# Patient Record
Sex: Male | Born: 1955
Health system: Southern US, Community
[De-identification: ages and names within clinical notes are randomized; demographics above are authoritative.]

## PROBLEM LIST (undated history)

## (undated) DIAGNOSIS — H509 Unspecified strabismus: Secondary | ICD-10-CM

## (undated) DIAGNOSIS — T7840XA Allergy, unspecified, initial encounter: Secondary | ICD-10-CM

## (undated) DIAGNOSIS — I10 Essential (primary) hypertension: Secondary | ICD-10-CM

## (undated) DIAGNOSIS — E785 Hyperlipidemia, unspecified: Secondary | ICD-10-CM

## (undated) HISTORY — DX: Unspecified strabismus: H50.9

## (undated) HISTORY — DX: Allergy, unspecified, initial encounter: T78.40XA

## (undated) HISTORY — DX: Essential (primary) hypertension: I10

## (undated) HISTORY — DX: Hyperlipidemia, unspecified: E78.5

---

## 2003-11-01 HISTORY — PX: COLONOSCOPY: SHX174

## 2010-08-01 ENCOUNTER — Emergency Department: Payer: Self-pay | Admitting: Emergency Medicine

## 2012-08-29 ENCOUNTER — Other Ambulatory Visit: Payer: Self-pay | Admitting: Family Medicine

## 2012-08-29 NOTE — Telephone Encounter (Signed)
Medication refilled per protocol.Patient needs to be seen before any further refills 

## 2012-10-17 ENCOUNTER — Encounter: Payer: Self-pay | Admitting: Family Medicine

## 2012-10-17 ENCOUNTER — Ambulatory Visit (INDEPENDENT_AMBULATORY_CARE_PROVIDER_SITE_OTHER): Payer: 59 | Admitting: Family Medicine

## 2012-10-17 VITALS — BP 128/74 | HR 60 | Temp 98.1°F | Resp 14 | Wt 193.0 lb

## 2012-10-17 DIAGNOSIS — T148 Other injury of unspecified body region: Secondary | ICD-10-CM

## 2012-10-17 DIAGNOSIS — W57XXXA Bitten or stung by nonvenomous insect and other nonvenomous arthropods, initial encounter: Secondary | ICD-10-CM

## 2012-10-17 DIAGNOSIS — I10 Essential (primary) hypertension: Secondary | ICD-10-CM | POA: Insufficient documentation

## 2012-10-17 DIAGNOSIS — E785 Hyperlipidemia, unspecified: Secondary | ICD-10-CM | POA: Insufficient documentation

## 2012-10-17 MED ORDER — DOXYCYCLINE HYCLATE 100 MG PO TABS: 100.0000 mg | ORAL_TABLET | Freq: Two times a day (BID) | ORAL | Status: DC

## 2012-10-17 MED ORDER — MOMETASONE FUROATE 0.1 % EX OINT: TOPICAL_OINTMENT | Freq: Every day | CUTANEOUS | Status: DC

## 2012-10-17 NOTE — Progress Notes (Signed)
  Subjective:    Patient ID: Daniel Rios, male    DOB: Mar 27, 1956, 57 y.o.   MRN: [id number]  HPI  3 weeks ago the patient was bit on his right superior thigh by a tick. He now has a 1.5 cm red welts on his upper thigh. He denies any fever, chills, myalgias, arthralgias. He denies any other rashes. He denies any evidence of erythema migrans.  He has no other symptoms of a tickborne illness. Past Medical History  Diagnosis Date  . Hyperlipidemia   . Hypertension    Current Outpatient Prescriptions on File Prior to Visit  Medication Sig Dispense Refill  . BENICAR 40 MG tablet TAKE 1 TABLET BY MOUTH DAILY FOR BLOOD PRESSURE  30 tablet  0   No current facility-administered medications on file prior to visit.   No Known Allergies History   Social History  . Marital Status: Unknown    Spouse Name: N/A    Number of Children: N/A  . Years of Education: N/A   Occupational History  . Not on file.   Social History Main Topics  . Smoking status: Never Smoker   . Smokeless tobacco: Not on file  . Alcohol Use: Not on file  . Drug Use: Not on file  . Sexually Active: Not on file   Other Topics Concern  . Not on file   Social History Narrative  . No narrative on file     Review of Systems  All other systems reviewed and are negative.       Objective:   Physical Exam  Vitals reviewed. Constitutional: He appears well-developed and well-nourished.  Cardiovascular: Normal rate and normal heart sounds.   Pulmonary/Chest: Effort normal and breath sounds normal.  Skin: There is erythema.   1.5 cm welt on the upper left thigh.        Assessment & Plan:  1. Tick bite  Elocon ointment 0.1%. Apply daily to the tick bite. Discussed with him the signs and symptoms of Lyme disease. If he develops any spreading red ringed rash, I instructed him to start doxycycline 100 mg by mouth twice a day for 21 days. Otherwise, treat this like a localized allergic reaction to insect bite.  Should resolve within one week.

## 2012-11-11 ENCOUNTER — Encounter: Payer: Self-pay | Admitting: Family Medicine

## 2012-11-23 ENCOUNTER — Other Ambulatory Visit: Payer: Self-pay | Admitting: Family Medicine

## 2012-12-23 ENCOUNTER — Ambulatory Visit (INDEPENDENT_AMBULATORY_CARE_PROVIDER_SITE_OTHER): Payer: 59 | Admitting: Family Medicine

## 2012-12-23 ENCOUNTER — Encounter: Payer: Self-pay | Admitting: Family Medicine

## 2012-12-23 VITALS — BP 132/74 | HR 56 | Temp 98.1°F | Resp 14 | Ht 71.0 in | Wt 193.0 lb

## 2012-12-23 DIAGNOSIS — Z0000 Encounter for general adult medical examination without abnormal findings: Secondary | ICD-10-CM

## 2012-12-23 DIAGNOSIS — H509 Unspecified strabismus: Secondary | ICD-10-CM | POA: Insufficient documentation

## 2012-12-23 LAB — COMPLETE METABOLIC PANEL WITH GFR
ALT: 29 U/L (ref 0–53)
AST: 31 U/L (ref 0–37)
Albumin: 4.3 g/dL (ref 3.5–5.2)
Alkaline Phosphatase: 64 U/L (ref 39–117)
BUN: 15 mg/dL (ref 6–23)
CO2: 27 mEq/L (ref 19–32)
Calcium: 9.5 mg/dL (ref 8.4–10.5)
Chloride: 103 mEq/L (ref 96–112)
Creat: 1.16 mg/dL (ref 0.50–1.35)
GFR, Est African American: 80 mL/min
GFR, Est Non African American: 69 mL/min
Glucose, Bld: 102 mg/dL — ABNORMAL HIGH (ref 70–99)
Potassium: 4.8 mEq/L (ref 3.5–5.3)
Sodium: 138 mEq/L (ref 135–145)
Total Bilirubin: 0.9 mg/dL (ref 0.3–1.2)
Total Protein: 7.2 g/dL (ref 6.0–8.3)

## 2012-12-23 LAB — CBC WITH DIFFERENTIAL/PLATELET
Basophils Absolute: 0.1 10*3/uL (ref 0.0–0.1)
Basophils Relative: 1 % (ref 0–1)
Eosinophils Absolute: 0.1 10*3/uL (ref 0.0–0.7)
Eosinophils Relative: 2 % (ref 0–5)
HCT: 47.3 % (ref 39.0–52.0)
Hemoglobin: 16.4 g/dL (ref 13.0–17.0)
Lymphocytes Relative: 31 % (ref 12–46)
Lymphs Abs: 1.9 10*3/uL (ref 0.7–4.0)
MCH: 32.4 pg (ref 26.0–34.0)
MCHC: 34.7 g/dL (ref 30.0–36.0)
MCV: 93.5 fL (ref 78.0–100.0)
Monocytes Absolute: 0.6 10*3/uL (ref 0.1–1.0)
Monocytes Relative: 9 % (ref 3–12)
Neutro Abs: 3.5 10*3/uL (ref 1.7–7.7)
Neutrophils Relative %: 57 % (ref 43–77)
Platelets: 313 10*3/uL (ref 150–400)
RBC: 5.06 MIL/uL (ref 4.22–5.81)
RDW: 13.4 % (ref 11.5–15.5)
WBC: 6.1 10*3/uL (ref 4.0–10.5)

## 2012-12-23 LAB — LIPID PANEL
Cholesterol: 164 mg/dL (ref 0–200)
HDL: 43 mg/dL (ref >39)
LDL Cholesterol: 102 mg/dL — ABNORMAL HIGH (ref 0–99)
Total CHOL/HDL Ratio: 3.8 Ratio
Triglycerides: 97 mg/dL (ref <150)
VLDL: 19 mg/dL (ref 0–40)

## 2012-12-23 LAB — PSA: PSA: 1.08 ng/mL (ref <=4.00)

## 2012-12-23 MED ORDER — MELOXICAM 15 MG PO TABS: 15.0000 mg | ORAL_TABLET | Freq: Every day | ORAL | Status: DC

## 2012-12-23 NOTE — Progress Notes (Signed)
Subjective:    Patient ID: Daniel Rios, male    DOB: 16-Feb-1956, 57 y.o.   MRN: [id number]  HPI Patient is a very pleasant 57 year old white male here today for complete physical exam. He is doing well with no major complaints. He does have an occasional pain in his right arm that radiates from his neck to his fingers it sounds neuropathic in nature. However the pain is mild and he does not want to get up at the present time. He would like to try some anti-inflammatories to see if this will abate the pain. Otherwise he has no complaints. His last colonoscopy was in 2005. He denies any melena or hematochezia. His last tetanus shot was in 2009. Past Medical History  Diagnosis Date  . Hypertension   . Hyperlipidemia   . Strabismus    Current Outpatient Prescriptions on File Prior to Visit  Medication Sig Dispense Refill  . aspirin 81 MG tablet Take 81 mg by mouth daily.      Marland Kitchen BENICAR 40 MG tablet TAKE 1 TABLET BY MOUTH DAILY FOR BLOOD PRESSURE  30 tablet  1  . rosuvastatin (CRESTOR) 20 MG tablet Take 20 mg by mouth daily.       No current facility-administered medications on file prior to visit.   No Known Allergies History   Social History  . Marital Status: Unknown    Spouse Name: N/A    Number of Children: N/A  . Years of Education: N/A   Occupational History  . Not on file.   Social History Main Topics  . Smoking status: Former Games developer  . Smokeless tobacco: Never Used  . Alcohol Use: No  . Drug Use: No  . Sexually Active: Not on file     Comment: married, quit smoking in 1982.     Other Topics Concern  . Not on file   Social History Narrative  . No narrative on file   Family History  Problem Relation Age of Onset  . Arthritis Mother   . Cancer Father   . COPD Brother       Review of Systems  All other systems reviewed and are negative.       Objective:   Physical Exam  Vitals reviewed. Constitutional: He is oriented to person, place, and time. He  appears well-developed and well-nourished. No distress.  HENT:  Head: Normocephalic and atraumatic.  Right Ear: External ear normal.  Left Ear: External ear normal.  Nose: Nose normal.  Mouth/Throat: Oropharynx is clear and moist. No oropharyngeal exudate.  Eyes: Conjunctivae and EOM are normal. Pupils are equal, round, and reactive to light. Right eye exhibits no discharge. Left eye exhibits no discharge. No scleral icterus.  Neck: Normal range of motion. Neck supple. No JVD present. No tracheal deviation present. No thyromegaly present.  Cardiovascular: Normal rate, regular rhythm, normal heart sounds and intact distal pulses.  Exam reveals no gallop and no friction rub.   No murmur heard. Pulmonary/Chest: Effort normal and breath sounds normal. No stridor. No respiratory distress. He has no wheezes. He has no rales. He exhibits no tenderness.  Abdominal: Soft. Bowel sounds are normal. He exhibits no distension and no mass. There is no tenderness. There is no rebound and no guarding.  Genitourinary: Penis normal. No penile tenderness.  Musculoskeletal: Normal range of motion. He exhibits no edema and no tenderness.  Lymphadenopathy:    He has no cervical adenopathy.  Neurological: He is alert and oriented to person, place, and time.  He displays normal reflexes. No cranial nerve deficit. He exhibits normal muscle tone. Coordination normal.  Skin: Skin is warm. No rash noted. He is not diaphoretic. No erythema. No pallor.  Psychiatric: He has a normal mood and affect. His behavior is normal. Judgment and thought content normal.   patient declines rectal exam.        Assessment & Plan:  1. Routine general medical examination at a health care facility Send patient home with stool cards x3. If they are negative he will need a colonoscopy next year. Blood pressure is well controlled.  Patient's goal LDL is less than 130. I will check a fasting lipid panel. Patient consents to a PSA. If the  PSA is elevated he will need a rectal exam. We discussed the shingles shot for next year. I did prescribe the patient meloxicam 15 mg by mouth daily for one month. If the pain persists or worsens in his right arm I would proceed with an MRI of the cervical spine. - CBC with Differential - COMPLETE METABOLIC PANEL WITH GFR - Lipid panel - PSA

## 2013-02-08 ENCOUNTER — Other Ambulatory Visit: Payer: Self-pay | Admitting: Family Medicine

## 2013-04-30 ENCOUNTER — Other Ambulatory Visit: Payer: Self-pay | Admitting: Family Medicine

## 2013-04-30 MED ORDER — ROSUVASTATIN CALCIUM 20 MG PO TABS: 20.0000 mg | ORAL_TABLET | Freq: Every day | ORAL | Status: DC

## 2013-04-30 NOTE — Telephone Encounter (Signed)
Rx Refilled  

## 2013-09-22 ENCOUNTER — Other Ambulatory Visit: Payer: Self-pay | Admitting: Family Medicine

## 2014-02-03 ENCOUNTER — Other Ambulatory Visit: Payer: Self-pay | Admitting: Family Medicine

## 2014-02-14 ENCOUNTER — Other Ambulatory Visit: Payer: Self-pay | Admitting: Family Medicine

## 2014-02-15 NOTE — Telephone Encounter (Signed)
Medication filled x1 with no refills.   Requires office visit before any further refills can be given.   Letter sent.  

## 2014-03-29 ENCOUNTER — Other Ambulatory Visit: Payer: 59

## 2014-03-29 DIAGNOSIS — Z0000 Encounter for general adult medical examination without abnormal findings: Secondary | ICD-10-CM

## 2014-03-29 DIAGNOSIS — Z79899 Other long term (current) drug therapy: Secondary | ICD-10-CM

## 2014-03-29 DIAGNOSIS — Z125 Encounter for screening for malignant neoplasm of prostate: Secondary | ICD-10-CM

## 2014-03-29 DIAGNOSIS — I10 Essential (primary) hypertension: Secondary | ICD-10-CM

## 2014-03-29 LAB — CBC WITH DIFFERENTIAL/PLATELET
Basophils Absolute: 0.1 10*3/uL (ref 0.0–0.1)
Basophils Relative: 1 % (ref 0–1)
Eosinophils Absolute: 0.1 10*3/uL (ref 0.0–0.7)
Eosinophils Relative: 2 % (ref 0–5)
HCT: 48.1 % (ref 39.0–52.0)
Hemoglobin: 16 g/dL (ref 13.0–17.0)
Lymphocytes Relative: 34 % (ref 12–46)
Lymphs Abs: 2.5 10*3/uL (ref 0.7–4.0)
MCH: 31.6 pg (ref 26.0–34.0)
MCHC: 33.3 g/dL (ref 30.0–36.0)
MCV: 94.9 fL (ref 78.0–100.0)
Monocytes Absolute: 0.7 10*3/uL (ref 0.1–1.0)
Monocytes Relative: 9 % (ref 3–12)
Neutro Abs: 3.9 10*3/uL (ref 1.7–7.7)
Neutrophils Relative %: 54 % (ref 43–77)
Platelets: 293 10*3/uL (ref 150–400)
RBC: 5.07 MIL/uL (ref 4.22–5.81)
RDW: 13.3 % (ref 11.5–15.5)
WBC: 7.3 10*3/uL (ref 4.0–10.5)

## 2014-03-29 LAB — LIPID PANEL
Cholesterol: 150 mg/dL (ref 0–200)
HDL: 45 mg/dL (ref >39)
LDL Cholesterol: 90 mg/dL (ref 0–99)
Total CHOL/HDL Ratio: 3.3 Ratio
Triglycerides: 76 mg/dL (ref <150)
VLDL: 15 mg/dL (ref 0–40)

## 2014-03-29 LAB — COMPLETE METABOLIC PANEL WITH GFR
ALT: 46 U/L (ref 0–53)
AST: 41 U/L — ABNORMAL HIGH (ref 0–37)
Albumin: 4.1 g/dL (ref 3.5–5.2)
Alkaline Phosphatase: 64 U/L (ref 39–117)
BUN: 18 mg/dL (ref 6–23)
CO2: 28 mEq/L (ref 19–32)
Calcium: 9.4 mg/dL (ref 8.4–10.5)
Chloride: 105 mEq/L (ref 96–112)
Creat: 1.15 mg/dL (ref 0.50–1.35)
GFR, Est African American: 80 mL/min
GFR, Est Non African American: 69 mL/min
Glucose, Bld: 92 mg/dL (ref 70–99)
Potassium: 5.1 mEq/L (ref 3.5–5.3)
Sodium: 139 mEq/L (ref 135–145)
Total Bilirubin: 0.8 mg/dL (ref 0.2–1.2)
Total Protein: 6.8 g/dL (ref 6.0–8.3)

## 2014-03-29 LAB — TSH: TSH: 1.162 u[IU]/mL (ref 0.350–4.500)

## 2014-03-30 LAB — PSA: PSA: 1.21 ng/mL (ref <=4.00)

## 2014-04-01 ENCOUNTER — Ambulatory Visit (INDEPENDENT_AMBULATORY_CARE_PROVIDER_SITE_OTHER): Payer: 59 | Admitting: Family Medicine

## 2014-04-01 ENCOUNTER — Encounter: Payer: Self-pay | Admitting: Family Medicine

## 2014-04-01 VITALS — BP 122/74 | HR 72 | Temp 98.5°F | Resp 18 | Ht 71.0 in | Wt 199.0 lb

## 2014-04-01 DIAGNOSIS — Z0000 Encounter for general adult medical examination without abnormal findings: Secondary | ICD-10-CM

## 2014-04-01 MED ORDER — TADALAFIL 20 MG PO TABS: 10.0000 mg | ORAL_TABLET | ORAL | Status: DC | PRN

## 2014-04-01 MED ORDER — ZOSTER VACCINE LIVE 19400 UNT/0.65ML ~~LOC~~ SOLR: 0.6500 mL | Freq: Once | SUBCUTANEOUS | Status: DC

## 2014-04-01 NOTE — Progress Notes (Signed)
Subjective:    Patient ID: Daniel Rios, male    DOB: 08/23/1955, 58 y.o.   MRN: [id number]  HPI  Patient is here today for complete physical exam. He has 1 medical concern. He reports polyuria. He denies dysuria. He denies blurry vision. He denies excessive thirst. He reports sudden urges and having to rush to the bathroom. He frequently awakens 3 or 4 times a night to go to the bathroom. He denies weak urinary stream. Lab on 03/29/2014  Component Date Value Ref Range Status  . WBC 03/29/2014 7.3  4.0 - 10.5 K/uL Final  . RBC 03/29/2014 5.07  4.22 - 5.81 MIL/uL Final  . Hemoglobin 03/29/2014 16.0  13.0 - 17.0 g/dL Final  . HCT 03/29/2014 48.1  39.0 - 52.0 % Final  . MCV 03/29/2014 94.9  78.0 - 100.0 fL Final  . MCH 03/29/2014 31.6  26.0 - 34.0 pg Final  . MCHC 03/29/2014 33.3  30.0 - 36.0 g/dL Final  . RDW 03/29/2014 13.3  11.5 - 15.5 % Final  . Platelets 03/29/2014 293  150 - 400 K/uL Final  . Neutrophils Relative % 03/29/2014 54  43 - 77 % Final  . Neutro Abs 03/29/2014 3.9  1.7 - 7.7 K/uL Final  . Lymphocytes Relative 03/29/2014 34  12 - 46 % Final  . Lymphs Abs 03/29/2014 2.5  0.7 - 4.0 K/uL Final  . Monocytes Relative 03/29/2014 9  3 - 12 % Final  . Monocytes Absolute 03/29/2014 0.7  0.1 - 1.0 K/uL Final  . Eosinophils Relative 03/29/2014 2  0 - 5 % Final  . Eosinophils Absolute 03/29/2014 0.1  0.0 - 0.7 K/uL Final  . Basophils Relative 03/29/2014 1  0 - 1 % Final  . Basophils Absolute 03/29/2014 0.1  0.0 - 0.1 K/uL Final  . Smear Review 03/29/2014 Criteria for review not met   Final  . Sodium 03/29/2014 139  135 - 145 mEq/L Final  . Potassium 03/29/2014 5.1  3.5 - 5.3 mEq/L Final  . Chloride 03/29/2014 105  96 - 112 mEq/L Final  . CO2 03/29/2014 28  19 - 32 mEq/L Final  . Glucose, Bld 03/29/2014 92  70 - 99 mg/dL Final  . BUN 03/29/2014 18  6 - 23 mg/dL Final  . Creat 03/29/2014 1.15  0.50 - 1.35 mg/dL Final  . Total Bilirubin 03/29/2014 0.8  0.2 - 1.2 mg/dL Final  .  Alkaline Phosphatase 03/29/2014 64  39 - 117 U/L Final  . AST 03/29/2014 41* 0 - 37 U/L Final  . ALT 03/29/2014 46  0 - 53 U/L Final  . Total Protein 03/29/2014 6.8  6.0 - 8.3 g/dL Final  . Albumin 03/29/2014 4.1  3.5 - 5.2 g/dL Final  . Calcium 03/29/2014 9.4  8.4 - 10.5 mg/dL Final  . GFR, Est African American 03/29/2014 80   Final  . GFR, Est Non African American 03/29/2014 69   Final   Comment:   The estimated GFR is a calculation valid for adults (>=63 years old) that uses the CKD-EPI algorithm to adjust for age and sex. It is   not to be used for children, pregnant women, hospitalized patients,    patients on dialysis, or with rapidly changing kidney function. According to the NKDEP, eGFR >89 is normal, 60-89 shows mild impairment, 30-59 shows moderate impairment, 15-29 shows severe impairment and <15 is ESRD.     Marland Kitchen TSH 03/29/2014 1.162  0.350 - 4.500 uIU/mL Final  .  Cholesterol 03/29/2014 150  0 - 200 mg/dL Final   Comment: ATP III Classification:       < 200        mg/dL        Desirable      200 - 239     mg/dL        Borderline High      >= 240        mg/dL        High     . Triglycerides 03/29/2014 76  <150 mg/dL Final  . HDL 03/29/2014 45  >39 mg/dL Final  . Total CHOL/HDL Ratio 03/29/2014 3.3   Final  . VLDL 03/29/2014 15  0 - 40 mg/dL Final  . LDL Cholesterol 03/29/2014 90  0 - 99 mg/dL Final   Comment:   Total Cholesterol/HDL Ratio:CHD Risk                        Coronary Heart Disease Risk Table                                        Men       Women          1/2 Average Risk              3.4        3.3              Average Risk              5.0        4.4           2X Average Risk              9.6        7.1           3X Average Risk             23.4       11.0 Use the calculated Patient Ratio above and the CHD Risk table  to determine the patient's CHD Risk. ATP III Classification (LDL):       < 100        mg/dL         Optimal      100 - 129     mg/dL          Near or Above Optimal      130 - 159     mg/dL         Borderline High      160 - 189     mg/dL         High       > 190        mg/dL         Very High     . PSA 03/29/2014 1.21  <=4.00 ng/mL Final   Comment: Test Methodology: ECLIA PSA (Electrochemiluminescence Immunoassay)   For PSA values from 2.5-4.0, particularly in younger men <22 years old, the AUA and NCCN suggest testing for % Free PSA (3515) and evaluation of the rate of increase in PSA (PSA velocity).    Past Medical History  Diagnosis Date  . Hypertension   . Hyperlipidemia   . Strabismus    No past surgical history on file. Current Outpatient Prescriptions on File Prior to Visit  Medication Sig Dispense Refill  . aspirin 81 MG tablet Take 81 mg by mouth daily.    Marland Kitchen BENICAR 40 MG tablet TAKE 1 TABLET BY MOUTH DAILY FOR BLOOD PRESSURE 30 tablet 11  . CRESTOR 20 MG tablet TAKE 1 TABLET BY MOUTH EVERY DAY 30 tablet 0   No current facility-administered medications on file prior to visit.   No Known Allergies History   Social History  . Marital Status: Unknown    Spouse Name: N/A    Number of Children: N/A  . Years of Education: N/A   Occupational History  . Not on file.   Social History Main Topics  . Smoking status: Former Research scientist (life sciences)  . Smokeless tobacco: Never Used  . Alcohol Use: No  . Drug Use: No  . Sexual Activity: Not on file     Comment: married, quit smoking in 1982.     Other Topics Concern  . Not on file   Social History Narrative   Family History  Problem Relation Age of Onset  . Arthritis Mother   . Cancer Father   . COPD Brother      Review of Systems  All other systems reviewed and are negative.      Objective:   Physical Exam  Constitutional: He is oriented to person, place, and time. He appears well-developed and well-nourished. No distress.  HENT:  Head: Normocephalic and atraumatic.  Right Ear: External ear normal.  Left Ear: External ear normal.  Nose: Nose normal.    Mouth/Throat: Oropharynx is clear and moist. No oropharyngeal exudate.  Eyes: Conjunctivae and EOM are normal. Pupils are equal, round, and reactive to light. Right eye exhibits no discharge. Left eye exhibits no discharge. No scleral icterus.  Neck: Normal range of motion. Neck supple. No JVD present. No tracheal deviation present. No thyromegaly present.  Cardiovascular: Normal rate, regular rhythm, normal heart sounds and intact distal pulses.  Exam reveals no gallop and no friction rub.   No murmur heard. Pulmonary/Chest: Effort normal and breath sounds normal. No stridor. No respiratory distress. He has no wheezes. He has no rales. He exhibits no tenderness.  Abdominal: Soft. Bowel sounds are normal. He exhibits no distension and no mass. There is no tenderness. There is no rebound and no guarding.  Genitourinary: Rectum normal and prostate normal.  Musculoskeletal: Normal range of motion. He exhibits no edema or tenderness.  Lymphadenopathy:    He has no cervical adenopathy.  Neurological: He is alert and oriented to person, place, and time. He has normal reflexes. He displays normal reflexes. No cranial nerve deficit. He exhibits normal muscle tone. Coordination normal.  Skin: Skin is warm. No rash noted. He is not diaphoretic. No erythema. No pallor.  Psychiatric: He has a normal mood and affect. His behavior is normal. Judgment and thought content normal.  Vitals reviewed. Patient does have external hemorrhoids        Assessment & Plan:  Routine general medical examination at a health care facility - Plan: Ambulatory referral to Gastroenterology  Patient's physical exam is completely normal. His blood pressure is excellent. His lab work is excellent. He has received his flu shot at work. We discussed the shingles vaccine. I believe his symptoms may be consistent with overactive bladder. Therefore I gave him samples of Vesicare 10 mg by mouth daily. Recheck in 2 weeks to see if this  medication has worked. Otherwise no changes in medication. I will schedule the patient for screening colonoscopy which is due.

## 2014-04-12 ENCOUNTER — Encounter: Payer: Self-pay | Admitting: Gastroenterology

## 2014-05-29 ENCOUNTER — Other Ambulatory Visit: Payer: Self-pay | Admitting: Family Medicine

## 2014-06-08 ENCOUNTER — Ambulatory Visit (AMBULATORY_SURGERY_CENTER): Payer: Self-pay | Admitting: *Deleted

## 2014-06-08 VITALS — Ht 71.0 in | Wt 197.2 lb

## 2014-06-08 DIAGNOSIS — Z1211 Encounter for screening for malignant neoplasm of colon: Secondary | ICD-10-CM

## 2014-06-08 MED ORDER — MOVIPREP 100 G PO SOLR: 1.0000 | Freq: Once | ORAL | Status: DC

## 2014-06-08 NOTE — Progress Notes (Signed)
No egg or soy allergy, no issues with past sedation, no diet pills, no home 02 use.  ewm Pt declined emmi video. ewm

## 2014-06-21 ENCOUNTER — Encounter: Payer: Self-pay | Admitting: Gastroenterology

## 2014-06-21 ENCOUNTER — Ambulatory Visit (AMBULATORY_SURGERY_CENTER): Payer: 59 | Admitting: Gastroenterology

## 2014-06-21 VITALS — BP 147/95 | HR 66 | Temp 97.4°F | Resp 104 | Ht 71.0 in | Wt 197.0 lb

## 2014-06-21 DIAGNOSIS — Z1211 Encounter for screening for malignant neoplasm of colon: Secondary | ICD-10-CM

## 2014-06-21 DIAGNOSIS — D120 Benign neoplasm of cecum: Secondary | ICD-10-CM

## 2014-06-21 HISTORY — PX: COLONOSCOPY: SHX174

## 2014-06-21 MED ORDER — SODIUM CHLORIDE 0.9 % IV SOLN: 500.0000 mL | INTRAVENOUS | Status: DC

## 2014-06-21 NOTE — Op Note (Signed)
[facility]  [address] [place], [address]   COLONOSCOPY PROCEDURE REPORT  PATIENT: Daniel, Rios  MR#: [id number] BIRTHDATE: [birth year] , 58  yrs. old GENDER: male ENDOSCOPIST: Ladene Artist, MD, [facility] PROCEDURE DATE:  06/21/2014 PROCEDURE:   Colonoscopy with snare polypectomy First Screening Colonoscopy - Avg.  risk and is 50 yrs.  old or older - No.  Prior Negative Screening - Now for repeat screening. 10 or more years since last screening  History of Adenoma - Now for follow-up colonoscopy & has been > or = to 3 yrs.  N/A  Polyps Removed Today? Yes. ASA CLASS:   Class II INDICATIONS:average risk for colorectal cancer. MEDICATIONS: Monitored anesthesia care and Propofol 250 mg IV DESCRIPTION OF PROCEDURE:   After the risks benefits and alternatives of the procedure were thoroughly explained, informed consent was obtained.  The digital rectal exam revealed no abnormalities of the rectum.   The LB [id number]  endoscope was introduced through the anus and advanced to the cecum, which was identified by both the appendix and ileocecal valve. No adverse events experienced.   The quality of the prep was good, using MoviPrep  The instrument was then slowly withdrawn as the colon was fully examined.    COLON FINDINGS: A sessile polyp measuring 6 mm in size was found at the cecum.  A polypectomy was performed with a cold snare.  The resection was complete, the polyp tissue was completely retrieved and sent to histology.   The examination was otherwise normal. Retroflexed views revealed no abnormalities. The time to cecum=1 minutes 28 seconds.  Withdrawal time=9 minutes 35 seconds.  The scope was withdrawn and the procedure completed. COMPLICATIONS: There were no immediate complications.  ENDOSCOPIC IMPRESSION: 1.   Sessile polyp at the cecum; polypectomy performed with a cold snare 2.   The examination was otherwise normal  RECOMMENDATIONS: 1.   Await pathology results 2.  Repeat colonoscopy in 5 years if polyp adenomatous; otherwise 10 years  eSigned:  Ladene Artist, MD, [facility] 06/21/2014 9:15 AM

## 2014-06-21 NOTE — Patient Instructions (Signed)
YOU HAD AN ENDOSCOPIC PROCEDURE TODAY AT [facility]: Refer to the procedure report that was given to you for any specific questions about what was found during the examination.  If the procedure report does not answer your questions, please call your gastroenterologist to clarify.  If you requested that your care partner not be given the details of your procedure findings, then the procedure report has been included in a sealed envelope for you to review at your convenience later.  YOU SHOULD EXPECT: Some feelings of bloating in the abdomen. Passage of more gas than usual.  Walking can help get rid of the air that was put into your GI tract during the procedure and reduce the bloating. If you had a lower endoscopy (such as a colonoscopy or flexible sigmoidoscopy) you may notice spotting of blood in your stool or on the toilet paper. If you underwent a bowel prep for your procedure, then you may not have a normal bowel movement for a few days.  DIET: Your first meal following the procedure should be a light meal and then it is ok to progress to your normal diet.  A half-sandwich or bowl of soup is an example of a good first meal.  Heavy or fried foods are harder to digest and may make you feel nauseous or bloated.  Likewise meals heavy in dairy and vegetables can cause extra gas to form and this can also increase the bloating.  Drink plenty of fluids but you should avoid alcoholic beverages for 24 hours.  ACTIVITY: Your care partner should take you home directly after the procedure.  You should plan to take it easy, moving slowly for the rest of the day.  You can resume normal activity the day after the procedure however you should NOT DRIVE or use heavy machinery for 24 hours (because of the sedation medicines used during the test).    SYMPTOMS TO REPORT IMMEDIATELY: A gastroenterologist can be reached at any hour.  During normal business hours, 8:30 AM to 5:00 PM Monday through Friday,  call [phone].  After hours and on weekends, please call the GI answering service at [phone] who will take a message and have the physician on call contact you.   Following lower endoscopy (colonoscopy or flexible sigmoidoscopy):  Excessive amounts of blood in the stool  Significant tenderness or worsening of abdominal pains  Swelling of the abdomen that is new, acute  Fever of 100F or higher  FOLLOW UP: If any biopsies were taken you will be contacted by phone or by letter within the next 1-3 weeks.  Call your gastroenterologist if you have not heard about the biopsies in 3 weeks.  Our staff will call the home number listed on your records the next business day following your procedure to check on you and address any questions or concerns that you may have at that time regarding the information given to you following your procedure. This is a courtesy call and so if there is no answer at the home number and we have not heard from you through the emergency physician on call, we will assume that you have returned to your regular daily activities without incident.  Await pathology  Please read over handout about polyps  Continue your normal medications  You might note some irritation in your nose or some drainage.  This may cause feels of congestion.  This is from the oxygen, which can be irritating.  There is no need for  concern, this should clear up in a day or so  SIGNATURES/CONFIDENTIALITY: You and/or your care partner have signed paperwork which will be entered into your electronic medical record.  These signatures attest to the fact that that the information above on your After Visit Summary has been reviewed and is understood.  Full responsibility of the confidentiality of this discharge information lies with you and/or your care-partner.

## 2014-06-21 NOTE — Progress Notes (Signed)
Called to room to assist during endoscopic procedure.  Patient ID and intended procedure confirmed with present staff. Received instructions for my participation in the procedure from the performing physician.  

## 2014-06-21 NOTE — Progress Notes (Signed)
  [facility] Post-op Note  Patient: Daniel Rios  Procedure(s) Performed: colonoscopy  Patient Location: [facility] - Recovery Area  Anesthesia Type: Deep Sedation/Propofol  Level of Consciousness: awake, oriented and patient cooperative  Airway and Oxygen Therapy: Patient Spontanous Breathing  Post-op Pain: none  Post-op Assessment:  Post-op Vital signs reviewed, Patient's Cardiovascular Status Stable, Respiratory Function Stable, Patent Airway, No signs of Nausea or vomiting and Pain level controlled  Post-op Vital Signs: Reviewed and stable  Complications: No apparent anesthesia complications  Damion Kant E 9:17 AM

## 2014-06-22 ENCOUNTER — Telehealth: Payer: Self-pay | Admitting: *Deleted

## 2014-06-22 NOTE — Telephone Encounter (Signed)
  Follow up Call-  Call back number 06/21/2014  Post procedure Call Back phone  # [phone]  Permission to leave phone message Yes     Patient questions:  Do you have a fever, pain , or abdominal swelling? No. Pain Score  0 *  Have you tolerated food without any problems? Yes.    Have you been able to return to your normal activities? Yes.    Do you have any questions about your discharge instructions: Diet   No. Medications  No. Follow up visit  No.  Do you have questions or concerns about your Care? No.  Actions: * If pain score is 4 or above: No action needed, pain <4.

## 2014-06-24 ENCOUNTER — Encounter: Payer: Self-pay | Admitting: Gastroenterology

## 2014-08-05 ENCOUNTER — Other Ambulatory Visit: Payer: Self-pay | Admitting: Family Medicine

## 2014-11-03 ENCOUNTER — Encounter: Payer: Self-pay | Admitting: *Deleted

## 2014-11-03 ENCOUNTER — Other Ambulatory Visit: Payer: Self-pay | Admitting: Family Medicine

## 2014-11-03 NOTE — Telephone Encounter (Signed)
REFILLED FOR 30 DAYS ONLY, NEEDS APPT

## 2014-12-03 ENCOUNTER — Ambulatory Visit (INDEPENDENT_AMBULATORY_CARE_PROVIDER_SITE_OTHER): Payer: 59 | Admitting: Family Medicine

## 2014-12-03 ENCOUNTER — Encounter: Payer: Self-pay | Admitting: Family Medicine

## 2014-12-03 VITALS — BP 132/90 | HR 76 | Temp 98.2°F | Resp 14 | Ht 71.0 in | Wt 196.0 lb

## 2014-12-03 DIAGNOSIS — L570 Actinic keratosis: Secondary | ICD-10-CM

## 2014-12-03 DIAGNOSIS — I10 Essential (primary) hypertension: Secondary | ICD-10-CM

## 2014-12-03 DIAGNOSIS — E785 Hyperlipidemia, unspecified: Secondary | ICD-10-CM

## 2014-12-03 LAB — COMPLETE METABOLIC PANEL WITH GFR
ALT: 37 U/L (ref 0–53)
AST: 34 U/L (ref 0–37)
Albumin: 4.3 g/dL (ref 3.5–5.2)
Alkaline Phosphatase: 49 U/L (ref 39–117)
BUN: 14 mg/dL (ref 6–23)
CO2: 28 mEq/L (ref 19–32)
Calcium: 9.5 mg/dL (ref 8.4–10.5)
Chloride: 104 mEq/L (ref 96–112)
Creat: 1.11 mg/dL (ref 0.50–1.35)
GFR, Est African American: 83 mL/min
GFR, Est Non African American: 72 mL/min
Glucose, Bld: 98 mg/dL (ref 70–99)
Potassium: 4.9 mEq/L (ref 3.5–5.3)
Sodium: 140 mEq/L (ref 135–145)
Total Bilirubin: 0.7 mg/dL (ref 0.2–1.2)
Total Protein: 7.2 g/dL (ref 6.0–8.3)

## 2014-12-03 LAB — LIPID PANEL
Cholesterol: 154 mg/dL (ref 0–200)
HDL: 48 mg/dL (ref >=40)
LDL Cholesterol: 91 mg/dL (ref 0–99)
Total CHOL/HDL Ratio: 3.2 Ratio
Triglycerides: 76 mg/dL (ref <150)
VLDL: 15 mg/dL (ref 0–40)

## 2014-12-03 NOTE — Progress Notes (Signed)
   Subjective:    Patient ID: Daniel Rios, male    DOB: 1955-11-01, 59 y.o.   MRN: [id number]  HPI He is here today for follow-up of his hypertension and hyperlipidemia.  He denies any chest pain shortness of breath or dyspnea on exertion. He denies any myalgias or right upper quadrant pain. His blood pressure is borderline controlled at 132/90. Prostate exam and colonoscopy are up-to-date. He does have a scaly red macule on his dorsum of his left forearm that has been persistent now for several years. Past Medical History  Diagnosis Date  . Hypertension   . Hyperlipidemia   . Strabismus   . Allergy    Past Surgical History  Procedure Laterality Date  . Colonoscopy  11-01-2003    normal   Current Outpatient Prescriptions on File Prior to Visit  Medication Sig Dispense Refill  . aspirin 81 MG tablet Take 81 mg by mouth daily.    Marland Kitchen BENICAR 40 MG tablet TAKE 1 TABLET BY MOUTH DAILY FOR BLOOD PRESSURE 30 tablet 10  . CRESTOR 20 MG tablet TAKE 1 TABLET BY MOUTH EVERY DAY 30 tablet 0  . loratadine (CLARITIN) 10 MG tablet Take 10 mg by mouth daily.    . Multiple Vitamin (MULTIVITAMIN) tablet Take 1 tablet by mouth daily. One a day 50+    . tadalafil (CIALIS) 20 MG tablet Take 0.5-1 tablets (10-20 mg total) by mouth every other day as needed for erectile dysfunction. 5 tablet 11   No current facility-administered medications on file prior to visit.   No Known Allergies History   Social History  . Marital Status: Unknown    Spouse Name: N/A  . Number of Children: N/A  . Years of Education: N/A   Occupational History  . Not on file.   Social History Main Topics  . Smoking status: Former Research scientist (life sciences)  . Smokeless tobacco: Never Used  . Alcohol Use: No  . Drug Use: No  . Sexual Activity: Not on file     Comment: married, quit smoking in 1982.     Other Topics Concern  . Not on file   Social History Narrative      Review of Systems  All other systems reviewed and are  negative.      Objective:   Physical Exam  Cardiovascular: Normal rate, regular rhythm and normal heart sounds.   No murmur heard. Pulmonary/Chest: Effort normal and breath sounds normal. No respiratory distress. He has no wheezes. He has no rales.  Abdominal: Soft. Bowel sounds are normal. He exhibits no distension. There is no tenderness. There is no rebound.  Skin: There is erythema.  Vitals reviewed.         Assessment & Plan:  HLD (hyperlipidemia) - Plan: COMPLETE METABOLIC PANEL WITH GFR, Lipid panel  Benign essential HTN  Actinic keratosis  Patient's blood pressures acceptable. I will check a CMP and a fasting lipid panel. Goal LDL is less than 130. The actinic keratosis present on his left forearm was treated with cryotherapy using liquid nitrogen for a total of 30 seconds. Patient tolerated procedure well without complication. The lesion persist, I will recommend a shave biopsy. At present the lesion is 1 cm in diameter

## 2014-12-06 ENCOUNTER — Encounter: Payer: Self-pay | Admitting: Family Medicine

## 2014-12-09 ENCOUNTER — Other Ambulatory Visit: Payer: Self-pay | Admitting: Family Medicine

## 2015-03-04 ENCOUNTER — Telehealth: Payer: Self-pay | Admitting: Family Medicine

## 2015-03-04 NOTE — Telephone Encounter (Signed)
Pt called back and states the pharmacy is out of stock of Benicar [facility] and pharmacy has [facility] other [facility] and stated they were [facility]icar as well, pt would like to know if there is something else he can take in place of Benicar. Please advise!

## 2015-03-04 NOTE — Telephone Encounter (Signed)
Patient is calling to say that he needs rx for benicar, and would like to speak with someone about this  [phone]

## 2015-03-04 NOTE — Telephone Encounter (Signed)
We can switch the patient to losartan 100 mg by mouth daily

## 2015-03-07 NOTE — Telephone Encounter (Signed)
lmtrc

## 2015-03-14 NOTE — Telephone Encounter (Signed)
Called and left message again to this pt to return call, pending call back

## 2015-05-07 ENCOUNTER — Emergency Department (HOSPITAL_COMMUNITY)
Admission: EM | Admit: 2015-05-07 | Discharge: 2015-05-07 | Disposition: A | Discharge status: Home / Self Care | Payer: Commercial Managed Care - HMO | Attending: Emergency Medicine | Admitting: Emergency Medicine

## 2015-05-07 ENCOUNTER — Encounter (HOSPITAL_COMMUNITY): Payer: Self-pay | Admitting: Emergency Medicine

## 2015-05-07 DIAGNOSIS — R0982 Postnasal drip: Secondary | ICD-10-CM | POA: Diagnosis not present

## 2015-05-07 DIAGNOSIS — Z8669 Personal history of other diseases of the nervous system and sense organs: Secondary | ICD-10-CM | POA: Diagnosis not present

## 2015-05-07 DIAGNOSIS — R0981 Nasal congestion: Secondary | ICD-10-CM | POA: Diagnosis not present

## 2015-05-07 DIAGNOSIS — Z7982 Long term (current) use of aspirin: Secondary | ICD-10-CM | POA: Insufficient documentation

## 2015-05-07 DIAGNOSIS — J3489 Other specified disorders of nose and nasal sinuses: Secondary | ICD-10-CM | POA: Insufficient documentation

## 2015-05-07 DIAGNOSIS — E785 Hyperlipidemia, unspecified: Secondary | ICD-10-CM | POA: Diagnosis not present

## 2015-05-07 DIAGNOSIS — I159 Secondary hypertension, unspecified: Secondary | ICD-10-CM | POA: Diagnosis not present

## 2015-05-07 DIAGNOSIS — Z87891 Personal history of nicotine dependence: Secondary | ICD-10-CM | POA: Insufficient documentation

## 2015-05-07 DIAGNOSIS — I10 Essential (primary) hypertension: Secondary | ICD-10-CM | POA: Diagnosis present

## 2015-05-07 LAB — I-STAT CHEM 8, ED
BUN: 20 mg/dL (ref 6–20)
Calcium, Ion: 1.19 mmol/L (ref 1.13–1.30)
Chloride: 102 mmol/L (ref 101–111)
Creatinine, Ser: 1.2 mg/dL (ref 0.61–1.24)
Glucose, Bld: 86 mg/dL (ref 65–99)
HCT: 53 % — ABNORMAL HIGH (ref 39.0–52.0)
Hemoglobin: 18 g/dL — ABNORMAL HIGH (ref 13.0–17.0)
Potassium: 5 mmol/L (ref 3.5–5.1)
Sodium: 140 mmol/L (ref 135–145)
TCO2: 28 mmol/L (ref 0–100)

## 2015-05-07 MED ORDER — DEXAMETHASONE 4 MG PO TABS
10.0000 mg | ORAL_TABLET | Freq: Once | ORAL | Status: AC
  Administered 2015-05-07: 10 mg via ORAL
  Filled 2015-05-07: qty 3

## 2015-05-07 NOTE — Discharge Instructions (Signed)
Your blood pressure was found to be high today but this does not appear to be life threatening or causing any immediate damage to your body.  Take your Benicar daily as directed and follow up with your primary care physician.  Avoid any decongestant medications.  Take the antibiotics prescribed by the clinic you saw this morning and try to use saline nose spray to help with the nasal congestion and sinus pressure you are experiencing.  Return immediately with sudden weakness, difficulty with your speech, change in vision, uncontrolled vomiting, severe headache.  You should be seen by your primary care physician by the end of this week to have BP rechecked.  Hypertension Hypertension, commonly called high blood pressure, is when the force of blood pumping through your arteries is too strong. Your arteries are the blood vessels that carry blood from your heart throughout your body. A blood pressure reading consists of a higher number over a lower number, such as 110/72. The higher number (systolic) is the pressure inside your arteries when your heart pumps. The lower number (diastolic) is the pressure inside your arteries when your heart relaxes. Ideally you want your blood pressure below 120/80. Hypertension forces your heart to work harder to pump blood. Your arteries may become narrow or stiff. Having untreated or uncontrolled hypertension can cause heart attack, stroke, kidney disease, and other problems. RISK FACTORS Some risk factors for high blood pressure are controllable. Others are not.  Risk factors you cannot control include:   Race. You may be at higher risk if you are African American.  Age. Risk increases with age.  Gender. Men are at higher risk than women before age 57 years. After age 76, women are at higher risk than men. Risk factors you can control include:  Not getting enough exercise or physical activity.  Being overweight.  Getting too much fat, sugar, calories, or salt in  your diet.  Drinking too much alcohol. SIGNS AND SYMPTOMS Hypertension does not usually cause signs or symptoms. Extremely high blood pressure (hypertensive crisis) may cause headache, anxiety, shortness of breath, and nosebleed. DIAGNOSIS To check if you have hypertension, your health care provider will measure your blood pressure while you are seated, with your arm held at the level of your heart. It should be measured at least twice using the same arm. Certain conditions can cause a difference in blood pressure between your right and left arms. A blood pressure reading that is higher than normal on one occasion does not mean that you need treatment. If it is not clear whether you have high blood pressure, you may be asked to return on a different day to have your blood pressure checked again. Or, you may be asked to monitor your blood pressure at home for 1 or more weeks. TREATMENT Treating high blood pressure includes making lifestyle changes and possibly taking medicine. Living a healthy lifestyle can help lower high blood pressure. You may need to change some of your habits. Lifestyle changes may include:  Following the DASH diet. This diet is high in fruits, vegetables, and whole grains. It is low in salt, red meat, and added sugars.  Keep your sodium intake below 2,300 mg per day.  Getting at least 30-45 minutes of aerobic exercise at least 4 times per week.  Losing weight if necessary.  Not smoking.  Limiting alcoholic beverages.  Learning ways to reduce stress. Your health care provider may prescribe medicine if lifestyle changes are not enough to get your blood  pressure under control, and if one of the following is true:  You are 8-37 years of age and your systolic blood pressure is above 140.  You are 36 years of age or older, and your systolic blood pressure is above 150.  Your diastolic blood pressure is above 90.  You have diabetes, and your systolic blood pressure is  over [id number] or your diastolic blood pressure is over 90.  You have kidney disease and your blood pressure is above 140/90.  You have heart disease and your blood pressure is above 140/90. Your personal target blood pressure may vary depending on your medical conditions, your age, and other factors. HOME CARE INSTRUCTIONS  Have your blood pressure rechecked as directed by your health care provider.   Take medicines only as directed by your health care provider. Follow the directions carefully. Blood pressure medicines must be taken as prescribed. The medicine does not work as well when you skip doses. Skipping doses also puts you at risk for problems.  Do not smoke.   Monitor your blood pressure at home as directed by your health care provider. SEEK MEDICAL CARE IF:   You think you are having a reaction to medicines taken.  You have recurrent headaches or feel dizzy.  You have swelling in your ankles.  You have trouble with your vision. SEEK IMMEDIATE MEDICAL CARE IF:  You develop a severe headache or confusion.  You have unusual weakness, numbness, or feel faint.  You have severe chest or abdominal pain.  You vomit repeatedly.  You have trouble breathing. MAKE SURE YOU:   Understand these instructions.  Will watch your condition.  Will get help right away if you are not doing well or get worse.   This information is not intended to replace advice given to you by your health care provider. Make sure you discuss any questions you have with your health care provider.   Document Released: 05/07/2005 Document Revised: 09/21/2014 Document Reviewed: 02/27/2013 Elsevier Interactive Patient Education [employer].

## 2015-05-07 NOTE — ED Notes (Signed)
Pt reports he has been taking OTC cold Meds. Last took at 0800 today.

## 2015-05-07 NOTE — ED Provider Notes (Signed)
CSN: [id number]     Arrival date & time 05/07/15  1333 History   First MD Initiated Contact with Patient 05/07/15 1610     Chief Complaint  Patient presents with  . Hypertension     (Consider location/radiation/quality/duration/timing/severity/associated sxs/prior Treatment) HPI Comments: 59 y.o. Male with history of HTN, hyperlipidemia, sleep apnea presents for HTN.  Patient reports that he has been having post nasal drip, sinus pressure for several weeks and [facility] where they found his blood pressure to be 180s/100 and they sent him to the ER.  The patient reports he has been taking a cold/cough medication with phenylephrine in it daily to help with his sinus symptoms.  He has been on benicar for a long time without any change in the medication.  Denies chest pain, palpitations, shortness of breath, leg swelling, difficulty urinating/decreased urination, severe headache, focal weakness, numbness, change in speech.     Past Medical History  Diagnosis Date  . Hypertension   . Hyperlipidemia   . Strabismus   . Allergy    Past Surgical History  Procedure Laterality Date  . Colonoscopy  11-01-2003    normal   Family History  Problem Relation Age of Onset  . Arthritis Mother   . Cancer Father     had metas.all over when found-? starting point   . COPD Brother   . Colon cancer Maternal Aunt    Social History  Substance Use Topics  . Smoking status: Former Research scientist (life sciences)  . Smokeless tobacco: Never Used  . Alcohol Use: No    Review of Systems  Constitutional: Negative for fever, chills, activity change and fatigue.  HENT: Positive for congestion, postnasal drip, rhinorrhea and sinus pressure. Negative for sore throat, tinnitus and voice change.   Eyes: Negative for pain and redness.  Respiratory: Negative for cough, chest tightness and shortness of breath.   Cardiovascular: Negative for chest pain and palpitations.  Gastrointestinal: Negative for nausea,  vomiting, abdominal pain, diarrhea and constipation.  Genitourinary: Negative for dysuria, urgency, hematuria, decreased urine volume and difficulty urinating.  Musculoskeletal: Negative for myalgias and back pain.  Skin: Negative for rash.  Neurological: Negative for dizziness, seizures, facial asymmetry, speech difficulty, weakness, numbness and headaches.  Hematological: Does not bruise/bleed easily.      Allergies  Review of patient's allergies indicates no known allergies.  Home Medications   Prior to Admission medications   Medication Sig Start Date End Date Taking? Authorizing Provider  aspirin 81 MG tablet Take 81 mg by mouth daily.    Historical Provider, MD  BENICAR 40 MG tablet TAKE 1 TABLET BY MOUTH DAILY FOR BLOOD PRESSURE 05/31/14   Susy Frizzle, MD  loratadine (CLARITIN) 10 MG tablet Take 10 mg by mouth daily.    Historical Provider, MD  Multiple Vitamin (MULTIVITAMIN) tablet Take 1 tablet by mouth daily. One a day 50+    Historical Provider, MD  rosuvastatin (CRESTOR) 20 MG tablet TAKE 1 TABLET BY MOUTH EVERY DAY 12/09/14   Susy Frizzle, MD  tadalafil (CIALIS) 20 MG tablet Take 0.5-1 tablets (10-20 mg total) by mouth every other day as needed for erectile dysfunction. 04/01/14   Susy Frizzle, MD   BP 164/95 mmHg  Pulse 66  Temp(Src) 98 F (36.7 C) (Oral)  Resp 18  Ht 5\' 11"  (1.803 m)  Wt 195 lb (88.451 kg)  BMI 27.21 kg/m2  SpO2 98% Physical Exam  Constitutional: He is oriented to person, place,  and time. He appears well-developed and well-nourished. No distress.  HENT:  Head: Normocephalic and atraumatic.  Right Ear: External ear normal.  Left Ear: External ear normal.  Mouth/Throat: Oropharynx is clear and moist. No oropharyngeal exudate.  Eyes: EOM are normal. Pupils are equal, round, and reactive to light.  Neck: Normal range of motion. Neck supple.  Cardiovascular: Normal rate, regular rhythm, normal heart sounds and intact distal pulses.   No  murmur heard. Pulmonary/Chest: Effort normal. No respiratory distress. He has no wheezes. He has no rales.  Abdominal: Soft. He exhibits no distension. There is no tenderness.  Musculoskeletal: He exhibits no edema.  Neurological: He is alert and oriented to person, place, and time. He has normal strength. No cranial nerve deficit or sensory deficit. He exhibits normal muscle tone. Coordination normal.  Skin: Skin is warm and dry. No rash noted. He is not diaphoretic.  Vitals reviewed.   ED Course  Procedures (including critical care time) Labs Review Labs Reviewed - No data to display  Imaging Review No results found. I have personally reviewed and evaluated these images and lab results as part of my medical decision-making.   EKG Interpretation None      MDM  Patient seen and evaluated in stable condition.  Normal examination.  HTN likely secondary to decongestant.  Labs and EKG unremarkable.  Patient informed to refrain from taking decongestant/phenylephrine.  Patient was discharged home in stable condition with instruction to follow up with his PCP for reevaluation of his BP. Final diagnoses:  None    1. HTN, establish and uncontrolled    Harvel Quale, MD 05/08/15 0210

## 2015-05-07 NOTE — ED Notes (Signed)
Declined W/C at D/C and was escorted to lobby by RN. 

## 2015-05-07 NOTE — ED Notes (Signed)
Pt sent here for htn; pt sts takes htn meds; pt having sinus congestion problems

## 2015-05-10 ENCOUNTER — Encounter: Payer: Self-pay | Admitting: Family Medicine

## 2015-05-10 ENCOUNTER — Ambulatory Visit (INDEPENDENT_AMBULATORY_CARE_PROVIDER_SITE_OTHER): Payer: 59 | Admitting: Family Medicine

## 2015-05-10 VITALS — BP 160/100 | HR 74 | Temp 98.1°F | Resp 16 | Ht 71.0 in | Wt 203.0 lb

## 2015-05-10 DIAGNOSIS — I10 Essential (primary) hypertension: Secondary | ICD-10-CM

## 2015-05-10 MED ORDER — HYDROCHLOROTHIAZIDE 25 MG PO TABS: 25.0000 mg | ORAL_TABLET | Freq: Every day | ORAL | Status: DC

## 2015-05-10 NOTE — Progress Notes (Signed)
   Subjective:    Patient ID: Daniel Rios, male    DOB: Jun 11, 1955, 59 y.o.   MRN: [id number]  HPI  Patient has high blood pressure. He has been on Benicar 40 mg by mouth daily for a long time. His blood pressure has been borderline 140/90 over the last 12 months. Recently he switched to generic Benicar and his blood pressure increased slightly. Over the last week he has been taking substantial amounts of Sudafed for sinus infection. He was seen at the emergency room with elevated blood pressure yesterday. His blood pressures consistently been 160-170/90-100. He is asymptomatic regarding his blood pressure. He denies any chest pain headaches shortness of breath or dyspnea on exertion. He is compliant taking his blood pressure medication. He is no longer taking the Sudafed Past Medical History  Diagnosis Date  . Hypertension   . Hyperlipidemia   . Strabismus   . Allergy    Past Surgical History  Procedure Laterality Date  . Colonoscopy  11-01-2003    normal   Current Outpatient Prescriptions on File Prior to Visit  Medication Sig Dispense Refill  . aspirin 81 MG tablet Take 81 mg by mouth daily.    Marland Kitchen BENICAR 40 MG tablet TAKE 1 TABLET BY MOUTH DAILY FOR BLOOD PRESSURE 30 tablet 10  . loratadine (CLARITIN) 10 MG tablet Take 10 mg by mouth daily.    . Multiple Vitamin (MULTIVITAMIN) tablet Take 1 tablet by mouth daily. One a day 50+    . rosuvastatin (CRESTOR) 20 MG tablet TAKE 1 TABLET BY MOUTH EVERY DAY 30 tablet 5  . tadalafil (CIALIS) 20 MG tablet Take 0.5-1 tablets (10-20 mg total) by mouth every other day as needed for erectile dysfunction. 5 tablet 11   No current facility-administered medications on file prior to visit.   No Known Allergies Social History   Social History  . Marital Status: Unknown    Spouse Name: N/A  . Number of Children: N/A  . Years of Education: N/A   Occupational History  . Not on file.   Social History Main Topics  . Smoking status: Former  Research scientist (life sciences)  . Smokeless tobacco: Never Used  . Alcohol Use: No  . Drug Use: No  . Sexual Activity: Not on file     Comment: married, quit smoking in 1982.     Other Topics Concern  . Not on file   Social History Narrative     Review of Systems  All other systems reviewed and are negative.      Objective:   Physical Exam  Cardiovascular: Normal rate, regular rhythm and normal heart sounds.   Pulmonary/Chest: Effort normal and breath sounds normal. No respiratory distress. He has no wheezes. He has no rales.  Abdominal: Soft. Bowel sounds are normal. He exhibits no distension. There is no tenderness. There is no rebound.  Musculoskeletal: He exhibits no edema.  Vitals reviewed.         Assessment & Plan:  Benign essential HTN - Plan: hydrochlorothiazide (HYDRODIURIL) 25 MG tablet  Continue Benicar 40 mg by mouth daily. Add hydrochlorothiazide 25 mg by mouth daily and recheck blood pressure in one month.

## 2015-06-02 ENCOUNTER — Other Ambulatory Visit: Payer: Self-pay | Admitting: Family Medicine

## 2015-06-02 NOTE — Telephone Encounter (Signed)
Refill appropriate and filled per protocol. 

## 2015-06-03 ENCOUNTER — Other Ambulatory Visit: Payer: Self-pay | Admitting: Family Medicine

## 2015-12-08 ENCOUNTER — Ambulatory Visit (INDEPENDENT_AMBULATORY_CARE_PROVIDER_SITE_OTHER): Payer: Commercial Managed Care - HMO | Admitting: Family Medicine

## 2015-12-08 ENCOUNTER — Encounter: Payer: Self-pay | Admitting: Family Medicine

## 2015-12-08 VITALS — BP 130/88 | HR 78 | Temp 98.2°F | Resp 14 | Ht 71.0 in | Wt 190.0 lb

## 2015-12-08 DIAGNOSIS — Z0000 Encounter for general adult medical examination without abnormal findings: Secondary | ICD-10-CM | POA: Diagnosis not present

## 2015-12-08 DIAGNOSIS — E785 Hyperlipidemia, unspecified: Secondary | ICD-10-CM

## 2015-12-08 DIAGNOSIS — I10 Essential (primary) hypertension: Secondary | ICD-10-CM | POA: Diagnosis not present

## 2015-12-08 LAB — CBC WITH DIFFERENTIAL/PLATELET
Basophils Absolute: 65 cells/uL (ref 0–200)
Basophils Relative: 1 %
Eosinophils Absolute: 130 cells/uL (ref 15–500)
Eosinophils Relative: 2 %
HCT: 50.3 % — ABNORMAL HIGH (ref 38.5–50.0)
Hemoglobin: 17 g/dL (ref 13.0–17.0)
Lymphocytes Relative: 37 %
Lymphs Abs: 2405 cells/uL (ref 850–3900)
MCH: 32.3 pg (ref 27.0–33.0)
MCHC: 33.8 g/dL (ref 32.0–36.0)
MCV: 95.4 fL (ref 80.0–100.0)
MPV: 9.2 fL (ref 7.5–12.5)
Monocytes Absolute: 520 cells/uL (ref 200–950)
Monocytes Relative: 8 %
Neutro Abs: 3380 cells/uL (ref 1500–7800)
Neutrophils Relative %: 52 %
Platelets: 364 10*3/uL (ref 140–400)
RBC: 5.27 MIL/uL (ref 4.20–5.80)
RDW: 13.3 % (ref 11.0–15.0)
WBC: 6.5 10*3/uL (ref 3.8–10.8)

## 2015-12-08 LAB — COMPLETE METABOLIC PANEL WITH GFR
ALT: 19 U/L (ref 9–46)
AST: 23 U/L (ref 10–35)
Albumin: 4.1 g/dL (ref 3.6–5.1)
Alkaline Phosphatase: 59 U/L (ref 40–115)
BUN: 13 mg/dL (ref 7–25)
CO2: 28 mmol/L (ref 20–31)
Calcium: 9.4 mg/dL (ref 8.6–10.3)
Chloride: 103 mmol/L (ref 98–110)
Creat: 1.26 mg/dL — ABNORMAL HIGH (ref 0.70–1.25)
GFR, Est African American: 71 mL/min (ref >=60)
GFR, Est Non African American: 61 mL/min (ref >=60)
Glucose, Bld: 108 mg/dL — ABNORMAL HIGH (ref 70–99)
Potassium: 4.9 mmol/L (ref 3.5–5.3)
Sodium: 139 mmol/L (ref 135–146)
Total Bilirubin: 0.8 mg/dL (ref 0.2–1.2)
Total Protein: 7.2 g/dL (ref 6.1–8.1)

## 2015-12-08 LAB — LIPID PANEL
Cholesterol: 264 mg/dL — ABNORMAL HIGH (ref 125–200)
HDL: 43 mg/dL (ref >=40)
LDL Cholesterol: 193 mg/dL — ABNORMAL HIGH (ref <130)
Total CHOL/HDL Ratio: 6.1 Ratio — ABNORMAL HIGH (ref <=5.0)
Triglycerides: 141 mg/dL (ref <150)
VLDL: 28 mg/dL (ref <30)

## 2015-12-08 NOTE — Progress Notes (Signed)
Subjective:    Patient ID: Daniel Rios, male    DOB: September 05, 1955, 60 y.o.   MRN: [id number]  HPI   At the patient's last office visit, we added hydrochlorothiazide to his medication due to elevated blood pressure. The patient try the medication on 2 separate occasions and developed an erythematous painful rash around his nose and on his lips. When he would stop the medication the rash would go away and as soon as he was started back the rash would return. Since discontinuing the hydrochlorothiazide the rash has not returned. Has drastically reduced his salt consumption and now his blood pressure even off the medication is outstanding at 130/88. He is due for prostate exam today. His colonoscopy was performed last year and was significant for at a mattress polyp. Repeat colonoscopy is due in 2021. Because of his age he is due for hepatitis C as well as HIV screening. The patient has no risk factors for these infections and prefers to decline this. He is due for a shingles vaccine as well as a tetanus shot. He believes he had a tetanus shot at work and he will check on that. I recommended that he call his insurance about the shingles vaccine prior to receiving it just to make sure is not too expensive Past Medical History  Diagnosis Date  . Hypertension   . Hyperlipidemia   . Strabismus   . Allergy    Past Surgical History  Procedure Laterality Date  . Colonoscopy  11-01-2003    normal   Current Outpatient Prescriptions on File Prior to Visit  Medication Sig Dispense Refill  . aspirin 81 MG tablet Take 81 mg by mouth daily.    Marland Kitchen loratadine (CLARITIN) 10 MG tablet Take 10 mg by mouth daily.    . Multiple Vitamin (MULTIVITAMIN) tablet Take 1 tablet by mouth daily. One a day 50+    . olmesartan (BENICAR) 40 MG tablet TAKE 1 TABLET BY MOUTH DAILY FOR BLOOD PRESSURE 30 tablet 6  . tadalafil (CIALIS) 20 MG tablet Take 0.5-1 tablets (10-20 mg total) by mouth every other day as needed for  erectile dysfunction. 5 tablet 11  . rosuvastatin (CRESTOR) 20 MG tablet TAKE 1 TABLET BY MOUTH EVERY DAY (Patient not taking: Reported on 12/08/2015) 30 tablet 5   No current facility-administered medications on file prior to visit.   Allergies  Allergen Reactions  . Hydrochlorothiazide Rash   Social History   Social History  . Marital Status: Unknown    Spouse Name: N/A  . Number of Children: N/A  . Years of Education: N/A   Occupational History  . Not on file.   Social History Main Topics  . Smoking status: Former Research scientist (life sciences)  . Smokeless tobacco: Never Used  . Alcohol Use: No  . Drug Use: No  . Sexual Activity: Not on file     Comment: married, quit smoking in 1982.     Other Topics Concern  . Not on file   Social History Narrative     Review of Systems  All other systems reviewed and are negative.      Objective:   Physical Exam  Constitutional: He is oriented to person, place, and time. He appears well-developed and well-nourished. No distress.  HENT:  Head: Normocephalic and atraumatic.  Right Ear: External ear normal.  Left Ear: External ear normal.  Nose: Nose normal.  Mouth/Throat: Oropharynx is clear and moist. No oropharyngeal exudate.  Eyes: Conjunctivae and EOM are normal. Pupils  are equal, round, and reactive to light. Right eye exhibits no discharge. Left eye exhibits no discharge. No scleral icterus.  Neck: Normal range of motion. Neck supple. No JVD present. No tracheal deviation present. No thyromegaly present.  Cardiovascular: Normal rate, regular rhythm, normal heart sounds and intact distal pulses.  Exam reveals no gallop and no friction rub.   No murmur heard. Pulmonary/Chest: Effort normal and breath sounds normal. No stridor. No respiratory distress. He has no wheezes. He has no rales. He exhibits no tenderness.  Abdominal: Soft. Bowel sounds are normal. He exhibits no distension and no mass. There is no tenderness. There is no rebound and no  guarding.  Genitourinary: Rectum normal and prostate normal. Guaiac negative stool. No penile tenderness.  Musculoskeletal: Normal range of motion. He exhibits no edema or tenderness.  Lymphadenopathy:    He has no cervical adenopathy.  Neurological: He is alert and oriented to person, place, and time. He has normal reflexes. He displays normal reflexes. No cranial nerve deficit. He exhibits normal muscle tone. Coordination normal.  Skin: Skin is warm. No rash noted. He is not diaphoretic. No erythema. No pallor.  Psychiatric: He has a normal mood and affect. His behavior is normal. Judgment and thought content normal.  Vitals reviewed.         Assessment & Plan:  Routine general medical examination at a health care facility - Plan: COMPLETE METABOLIC PANEL WITH GFR, CBC with Differential/Platelet, Lipid panel, PSA  Benign essential HTN  HLD (hyperlipidemia)  I am very happy with his blood pressure. We will make no changes in his blood pressure medication. I will check a CBC, CMP, fasting lipid panel, and a PSA. His prostate exam is normal. Colonoscopy is up-to-date. He declines HIV and hepatitis C screening. He is a very low risk individual I believe this is appropriate. He still complains of aches and pains on Crestor. He would be interested in discontinuing this medication if his cholesterol is acceptable. I recommended a shingles vaccine. The patient will check on the price. He will inquire about the last date of his tetanus shot prior to receiving it

## 2015-12-09 LAB — PSA: PSA: 1.29 ng/mL (ref <=4.00)

## 2015-12-12 ENCOUNTER — Other Ambulatory Visit: Payer: Self-pay | Admitting: Family Medicine

## 2015-12-12 MED ORDER — ROSUVASTATIN CALCIUM 20 MG PO TABS: 20.0000 mg | ORAL_TABLET | Freq: Every day | ORAL | 1 refills | Status: DC

## 2016-02-12 ENCOUNTER — Other Ambulatory Visit: Payer: Self-pay | Admitting: Family Medicine

## 2016-06-11 ENCOUNTER — Other Ambulatory Visit: Payer: Self-pay | Admitting: Family Medicine

## 2016-09-11 ENCOUNTER — Other Ambulatory Visit: Payer: Self-pay | Admitting: Family Medicine

## 2016-10-02 DIAGNOSIS — L570 Actinic keratosis: Secondary | ICD-10-CM | POA: Diagnosis not present

## 2016-10-02 DIAGNOSIS — D225 Melanocytic nevi of trunk: Secondary | ICD-10-CM | POA: Diagnosis not present

## 2016-10-02 DIAGNOSIS — L821 Other seborrheic keratosis: Secondary | ICD-10-CM | POA: Diagnosis not present

## 2016-10-02 DIAGNOSIS — L820 Inflamed seborrheic keratosis: Secondary | ICD-10-CM | POA: Diagnosis not present

## 2016-10-02 DIAGNOSIS — D1801 Hemangioma of skin and subcutaneous tissue: Secondary | ICD-10-CM | POA: Diagnosis not present

## 2016-10-25 DIAGNOSIS — L308 Other specified dermatitis: Secondary | ICD-10-CM | POA: Diagnosis not present

## 2016-12-13 ENCOUNTER — Other Ambulatory Visit: Payer: Self-pay | Admitting: Family Medicine

## 2017-01-22 ENCOUNTER — Encounter: Payer: Commercial Managed Care - HMO | Admitting: Family Medicine

## 2017-02-07 ENCOUNTER — Encounter: Payer: Self-pay | Admitting: Family Medicine

## 2017-02-07 ENCOUNTER — Ambulatory Visit (INDEPENDENT_AMBULATORY_CARE_PROVIDER_SITE_OTHER): Payer: 59 | Admitting: Family Medicine

## 2017-02-07 VITALS — BP 128/80 | HR 98 | Temp 97.9°F | Resp 16 | Ht 71.0 in | Wt 176.0 lb

## 2017-02-07 DIAGNOSIS — Z0000 Encounter for general adult medical examination without abnormal findings: Secondary | ICD-10-CM | POA: Diagnosis not present

## 2017-02-07 DIAGNOSIS — Z23 Encounter for immunization: Secondary | ICD-10-CM | POA: Diagnosis not present

## 2017-02-07 DIAGNOSIS — E7800 Pure hypercholesterolemia, unspecified: Secondary | ICD-10-CM | POA: Diagnosis not present

## 2017-02-07 DIAGNOSIS — I10 Essential (primary) hypertension: Secondary | ICD-10-CM

## 2017-02-07 LAB — CBC WITH DIFFERENTIAL/PLATELET
Basophils Absolute: 60 cells/uL (ref 0–200)
Basophils Relative: 0.9 %
Eosinophils Absolute: 80 cells/uL (ref 15–500)
Eosinophils Relative: 1.2 %
HCT: 47.4 % (ref 38.5–50.0)
Hemoglobin: 16 g/dL (ref 13.2–17.1)
Lymphs Abs: 2097 cells/uL (ref 850–3900)
MCH: 32.5 pg (ref 27.0–33.0)
MCHC: 33.8 g/dL (ref 32.0–36.0)
MCV: 96.3 fL (ref 80.0–100.0)
MPV: 9.3 fL (ref 7.5–12.5)
Monocytes Relative: 7.2 %
Neutro Abs: 3980 cells/uL (ref 1500–7800)
Neutrophils Relative %: 59.4 %
Platelets: 320 10*3/uL (ref 140–400)
RBC: 4.92 10*6/uL (ref 4.20–5.80)
RDW: 12 % (ref 11.0–15.0)
Total Lymphocyte: 31.3 %
WBC mixed population: 482 cells/uL (ref 200–950)
WBC: 6.7 10*3/uL (ref 3.8–10.8)

## 2017-02-07 LAB — COMPLETE METABOLIC PANEL WITH GFR
AG Ratio: 1.5 (calc) (ref 1.0–2.5)
ALT: 24 U/L (ref 9–46)
AST: 27 U/L (ref 10–35)
Albumin: 4.3 g/dL (ref 3.6–5.1)
Alkaline phosphatase (APISO): 55 U/L (ref 40–115)
BUN: 16 mg/dL (ref 7–25)
CO2: 29 mmol/L (ref 20–32)
Calcium: 9.9 mg/dL (ref 8.6–10.3)
Chloride: 103 mmol/L (ref 98–110)
Creat: 1.23 mg/dL (ref 0.70–1.25)
GFR, Est African American: 72 mL/min/{1.73_m2} (ref >=60)
GFR, Est Non African American: 62 mL/min/{1.73_m2} (ref >=60)
Globulin: 2.9 g/dL (calc) (ref 1.9–3.7)
Glucose, Bld: 105 mg/dL — ABNORMAL HIGH (ref 65–99)
Potassium: 5 mmol/L (ref 3.5–5.3)
Sodium: 138 mmol/L (ref 135–146)
Total Bilirubin: 0.8 mg/dL (ref 0.2–1.2)
Total Protein: 7.2 g/dL (ref 6.1–8.1)

## 2017-02-07 LAB — LIPID PANEL
Cholesterol: 176 mg/dL (ref <200)
HDL: 54 mg/dL (ref >40)
LDL Cholesterol (Calc): 104 mg/dL (calc) — ABNORMAL HIGH
Non-HDL Cholesterol (Calc): 122 mg/dL (calc) (ref <130)
Total CHOL/HDL Ratio: 3.3 (calc) (ref <5.0)
Triglycerides: 90 mg/dL (ref <150)

## 2017-02-07 LAB — PSA: PSA: 1.2 ng/mL (ref <=4.0)

## 2017-02-07 NOTE — Progress Notes (Signed)
Subjective:    Patient ID: Daniel Rios, male    DOB: 1956/02/05, 61 y.o.   MRN: [id number]  HPI  Patient is here today for complete physical exam. He denies any medical concerns other than postnasal drip and sinus pressure despite taking over-the-counter Zyrtec. He states it always occurs this time year during ragweed season. He is interested in other options he can take for sinusitis. We discussed Flonase. His colonoscopy is up-to-date. He is due for prostate exam but he declines a digital rectal exam but does request a PSA. He declines HIV and hepatitis C screening.  Past Medical History:  Diagnosis Date  . Allergy   . Hyperlipidemia   . Hypertension   . Strabismus    Past Surgical History:  Procedure Laterality Date  . COLONOSCOPY  11-01-2003   normal   Current Outpatient Prescriptions on File Prior to Visit  Medication Sig Dispense Refill  . aspirin 81 MG tablet Take 81 mg by mouth daily.    Marland Kitchen loratadine (CLARITIN) 10 MG tablet Take 10 mg by mouth daily.    . Multiple Vitamin (MULTIVITAMIN) tablet Take 1 tablet by mouth daily. One a day 50+    . olmesartan (BENICAR) 40 MG tablet TAKE 1 TABLET BY MOUTH EVERY DAY 30 tablet 6  . rosuvastatin (CRESTOR) 20 MG tablet TAKE 1 TABLET BY MOUTH EVERY DAY 90 tablet 0  . tadalafil (CIALIS) 20 MG tablet Take 0.5-1 tablets (10-20 mg total) by mouth every other day as needed for erectile dysfunction. 5 tablet 11   No current facility-administered medications on file prior to visit.    Allergies  Allergen Reactions  . Hydrochlorothiazide Rash   Social History   Social History  . Marital status: Unknown    Spouse name: N/A  . Number of children: N/A  . Years of education: N/A   Occupational History  . Not on file.   Social History Main Topics  . Smoking status: Former Research scientist (life sciences)  . Smokeless tobacco: Never Used  . Alcohol use No  . Drug use: No  . Sexual activity: Not on file     Comment: married, quit smoking in 1982.      Other Topics Concern  . Not on file   Social History Narrative  . No narrative on file     Review of Systems  All other systems reviewed and are negative.      Objective:   Physical Exam  Constitutional: He is oriented to person, place, and time. He appears well-developed and well-nourished. No distress.  HENT:  Head: Normocephalic and atraumatic.  Right Ear: External ear normal.  Left Ear: External ear normal.  Nose: Nose normal.  Mouth/Throat: Oropharynx is clear and moist. No oropharyngeal exudate.  Eyes: Pupils are equal, round, and reactive to light. Conjunctivae and EOM are normal. Right eye exhibits no discharge. Left eye exhibits no discharge. No scleral icterus.  Neck: Normal range of motion. Neck supple. No JVD present. No tracheal deviation present. No thyromegaly present.  Cardiovascular: Normal rate, regular rhythm, normal heart sounds and intact distal pulses.  Exam reveals no gallop and no friction rub.   No murmur heard. Pulmonary/Chest: Effort normal and breath sounds normal. No stridor. No respiratory distress. He has no wheezes. He has no rales. He exhibits no tenderness.  Abdominal: Soft. Bowel sounds are normal. He exhibits no distension and no mass. There is no tenderness. There is no rebound and no guarding.  Musculoskeletal: Normal range of motion. He  exhibits no edema or tenderness.  Lymphadenopathy:    He has no cervical adenopathy.  Neurological: He is alert and oriented to person, place, and time. He has normal reflexes. No cranial nerve deficit. He exhibits normal muscle tone. Coordination normal.  Skin: Skin is warm. No rash noted. He is not diaphoretic. No erythema. No pallor.  Psychiatric: He has a normal mood and affect. His behavior is normal. Judgment and thought content normal.  Vitals reviewed.         Assessment & Plan:  Routine general medical examination at a health care facility  Benign essential HTN  Pure  hypercholesterolemia Physical exam today is completely normal. Blood pressure is excellent. Pulse oximetry was recorded incorrectly in the vitals. His pulse oximetry is 98% on room air not 58%. I will check a CBC, CMP, fasting lipid panel, and a PSA. He declines HIV and hepatitis C screening. He did receive his flu shot today. We also discussed the shingles vaccine as well as a tetanus shot but he declines them at the present time.

## 2017-03-07 DIAGNOSIS — M1990 Unspecified osteoarthritis, unspecified site: Secondary | ICD-10-CM | POA: Diagnosis not present

## 2017-03-07 DIAGNOSIS — M7751 Other enthesopathy of right foot: Secondary | ICD-10-CM | POA: Diagnosis not present

## 2017-03-07 DIAGNOSIS — M659 Synovitis and tenosynovitis, unspecified: Secondary | ICD-10-CM | POA: Diagnosis not present

## 2017-03-07 DIAGNOSIS — M67471 Ganglion, right ankle and foot: Secondary | ICD-10-CM | POA: Diagnosis not present

## 2017-03-13 ENCOUNTER — Other Ambulatory Visit: Payer: Self-pay | Admitting: Family Medicine

## 2017-03-14 DIAGNOSIS — M7751 Other enthesopathy of right foot: Secondary | ICD-10-CM | POA: Diagnosis not present

## 2017-03-14 DIAGNOSIS — M67471 Ganglion, right ankle and foot: Secondary | ICD-10-CM | POA: Diagnosis not present

## 2017-03-14 DIAGNOSIS — M659 Synovitis and tenosynovitis, unspecified: Secondary | ICD-10-CM | POA: Diagnosis not present

## 2017-04-20 ENCOUNTER — Other Ambulatory Visit: Payer: Self-pay | Admitting: Family Medicine

## 2017-05-21 HISTORY — PX: FOOT SURGERY: SHX648

## 2017-06-19 ENCOUNTER — Other Ambulatory Visit: Payer: Self-pay | Admitting: Family Medicine

## 2017-06-19 DIAGNOSIS — J0100 Acute maxillary sinusitis, unspecified: Secondary | ICD-10-CM | POA: Diagnosis not present

## 2017-09-24 ENCOUNTER — Other Ambulatory Visit: Payer: Self-pay | Admitting: Family Medicine

## 2017-09-30 DIAGNOSIS — L570 Actinic keratosis: Secondary | ICD-10-CM | POA: Diagnosis not present

## 2017-09-30 DIAGNOSIS — L821 Other seborrheic keratosis: Secondary | ICD-10-CM | POA: Diagnosis not present

## 2017-09-30 DIAGNOSIS — L814 Other melanin hyperpigmentation: Secondary | ICD-10-CM | POA: Diagnosis not present

## 2017-09-30 DIAGNOSIS — D225 Melanocytic nevi of trunk: Secondary | ICD-10-CM | POA: Diagnosis not present

## 2017-09-30 DIAGNOSIS — L820 Inflamed seborrheic keratosis: Secondary | ICD-10-CM | POA: Diagnosis not present

## 2017-10-17 ENCOUNTER — Other Ambulatory Visit: Payer: Self-pay | Admitting: Family Medicine

## 2017-10-17 ENCOUNTER — Telehealth: Payer: Self-pay | Admitting: *Deleted

## 2017-10-17 MED ORDER — LOSARTAN POTASSIUM 100 MG PO TABS: 100.0000 mg | ORAL_TABLET | Freq: Every day | ORAL | 3 refills | Status: DC

## 2017-10-17 MED ORDER — OLMESARTAN MEDOXOMIL 40 MG PO TABS: 40.0000 mg | ORAL_TABLET | Freq: Every day | ORAL | 3 refills | Status: DC

## 2017-10-17 NOTE — Telephone Encounter (Signed)
Could take losartan 100 mg a day instead

## 2017-10-17 NOTE — Telephone Encounter (Signed)
Prescription sent to pharmacy.   Call placed to patient. LMTRC.  

## 2017-10-17 NOTE — Telephone Encounter (Signed)
Received fax requesting alternative to Olmesartan.   Medication is on back order.   MD please advise.

## 2017-10-21 NOTE — Telephone Encounter (Signed)
Call placed to patient and patient made aware.  

## 2017-11-11 ENCOUNTER — Ambulatory Visit: Payer: 59 | Admitting: Podiatry

## 2017-11-18 ENCOUNTER — Ambulatory Visit: Payer: 59 | Admitting: Podiatry

## 2017-11-18 ENCOUNTER — Other Ambulatory Visit: Payer: Self-pay | Admitting: Podiatry

## 2017-11-18 ENCOUNTER — Encounter: Payer: Self-pay | Admitting: Podiatry

## 2017-11-18 ENCOUNTER — Ambulatory Visit (INDEPENDENT_AMBULATORY_CARE_PROVIDER_SITE_OTHER): Payer: 59

## 2017-11-18 VITALS — BP 173/102 | HR 55

## 2017-11-18 DIAGNOSIS — M67471 Ganglion, right ankle and foot: Secondary | ICD-10-CM | POA: Diagnosis not present

## 2017-11-18 DIAGNOSIS — M898X7 Other specified disorders of bone, ankle and foot: Secondary | ICD-10-CM

## 2017-11-18 NOTE — Patient Instructions (Signed)
Pre-Operative Instructions  Congratulations, you have decided to take an important step towards improving your quality of life.  You can be assured that the doctors and staff at [facility] will be with you every step of the way.  Here are some important things you should know:  1. Plan to be at the surgery center/hospital at least 1 (one) hour prior to your scheduled time, unless otherwise directed by the surgical center/hospital staff.  You must have a responsible adult accompany you, remain during the surgery and drive you home.  Make sure you have directions to the surgical center/hospital to ensure you arrive on time. 2. If you are having surgery at [facility] or [facility], you will need a copy of your medical history and physical form from your family physician within one month prior to the date of surgery. We will give you a form for your primary physician to complete.  3. We make every effort to accommodate the date you request for surgery.  However, there are times where surgery dates or times have to be moved.  We will contact you as soon as possible if a change in schedule is required.   4. No aspirin/ibuprofen for one week before surgery.  If you are on aspirin, any non-steroidal anti-inflammatory medications (Mobic, Aleve, Ibuprofen) should not be taken seven (7) days prior to your surgery.  You make take Tylenol for pain prior to surgery.  5. Medications - If you are taking daily heart and blood pressure medications, seizure, reflux, allergy, asthma, anxiety, pain or diabetes medications, make sure you notify the surgery center/hospital before the day of surgery so they can tell you which medications you should take or avoid the day of surgery. 6. No food or drink after midnight the night before surgery unless directed otherwise by surgical center/hospital staff. 7. No alcoholic beverages 24-hours prior to surgery.  No smoking 24-hours prior or 24-hours after  surgery. 8. Wear loose pants or shorts. They should be loose enough to fit over bandages, boots, and casts. 9. Don't wear slip-on shoes. Sneakers are preferred. 10. Bring your boot with you to the surgery center/hospital.  Also bring crutches or a walker if your physician has prescribed it for you.  If you do not have this equipment, it will be provided for you after surgery. 11. If you have not been contacted by the surgery center/hospital by the day before your surgery, call to confirm the date and time of your surgery. 12. Leave-time from work may vary depending on the type of surgery you have.  Appropriate arrangements should be made prior to surgery with your employer. 13. Prescriptions will be provided immediately following surgery by your doctor.  Fill these as soon as possible after surgery and take the medication as directed. Pain medications will not be refilled on weekends and must be approved by the doctor. 14. Remove nail polish on the operative foot and avoid getting pedicures prior to surgery. 15. Wash the night before surgery.  The night before surgery wash the foot and leg well with water and the antibacterial soap provided. Be sure to pay special attention to beneath the toenails and in between the toes.  Wash for at least three (3) minutes. Rinse thoroughly with water and dry well with a towel.  Perform this wash unless told not to do so by your physician.  Enclosed: 1 Ice pack (please put in freezer the night before surgery)   1 Hibiclens skin cleaner     Pre-op instructions  If you have any questions regarding the instructions, please do not hesitate to call our office.  [place]: [address], [place], [place] [address] -- [phone]  [place]: [address] [place], [place] [address] -- [phone]  [place]: [address]  [place], [place] [address] -- [phone]  [place]: [address], [address], [place], [place] [address] -- [phone]  Website:  [email or web address] 

## 2017-11-18 NOTE — Progress Notes (Signed)
dg 

## 2017-11-22 ENCOUNTER — Telehealth: Payer: Self-pay | Admitting: Family Medicine

## 2017-11-22 NOTE — Telephone Encounter (Signed)
I would add hctz 25 mg a day.

## 2017-11-22 NOTE — Telephone Encounter (Signed)
Pt was switched from Benicar to Losartan and the Losartan does not seem to be working as well as the [employer]. He has been taking it for about 2-3 weeks and his BP'S are mid to high 140-150/80-90's. It was really high at foot doctor's as well 173/101 but he does have some white coat issues.

## 2017-11-24 NOTE — Progress Notes (Signed)
   HPI: 62 year old male presenting today as a new patient with a chief complaint of stabbing pain to the right dorsal foot that has been present intermittently for the past 2 years. Walking increases the pain. He has received injections from Dr. Gershon Mussel that has provided some temporary relief. He has not done anything at home for treatment. Patient is here for further evaluation and treatment.   Past Medical History:  Diagnosis Date  . Allergy   . Hyperlipidemia   . Hypertension   . Strabismus      Physical Exam: General: The patient is alert and oriented x3 in no acute distress.  Dermatology: Skin is warm, dry and supple bilateral lower extremities. Negative for open lesions or macerations.  Vascular: Palpable pedal pulses bilaterally. No edema or erythema noted. Capillary refill within normal limits.  Neurological: Epicritic and protective threshold grossly intact bilaterally.   Musculoskeletal Exam: Palpable midtarsal exostosis noted to the right dorsal midfoot. Fluctuant mass noted just superficial to tarsal exostosis of the right dorsal midfoot. Range of motion within normal limits to all pedal and ankle joints bilateral. Muscle strength 5/5 in all groups bilateral.   Radiographic Exam:  Normal osseous mineralization. Joint spaces preserved. No fracture/dislocation/boney destruction.    Assessment: 1. Tarsal exostosis right dorsal midfoot 2. Ganglion cyst right dorsal midfoot   Plan of Care:  1. Patient evaluated. X-Rays reviewed.  2. Today we discussed the conservative versus surgical management of the presenting pathology. The patient opts for surgical management. All possible complications and details of the procedure were explained. All patient questions were answered. No guarantees were expressed or implied. 3. Authorization for surgery was initiated today. Surgery will consist of excision of ganglion cyst right, midtarsal exostectomy right.  4. Short CAM boot dispensed.    5. Return to clinic one week post op.      Edrick Kins, DPM [facility]  Dr. Edrick Kins, DPM    [address], [place] [address]                Office [phone]  Fax [phone]

## 2017-11-25 NOTE — Telephone Encounter (Signed)
LMTRC

## 2017-12-11 NOTE — Telephone Encounter (Signed)
No return call - closing note 

## 2017-12-30 ENCOUNTER — Other Ambulatory Visit: Payer: Self-pay | Admitting: Family Medicine

## 2017-12-30 MED ORDER — ROSUVASTATIN CALCIUM 20 MG PO TABS: 20.0000 mg | ORAL_TABLET | Freq: Every day | ORAL | 1 refills | Status: DC

## 2018-01-02 DIAGNOSIS — M67471 Ganglion, right ankle and foot: Secondary | ICD-10-CM | POA: Diagnosis not present

## 2018-01-02 DIAGNOSIS — M25774 Osteophyte, right foot: Secondary | ICD-10-CM | POA: Diagnosis not present

## 2018-01-02 DIAGNOSIS — M25771 Osteophyte, right ankle: Secondary | ICD-10-CM | POA: Diagnosis not present

## 2018-01-02 DIAGNOSIS — D1631 Benign neoplasm of short bones of right lower limb: Secondary | ICD-10-CM | POA: Diagnosis not present

## 2018-01-08 ENCOUNTER — Ambulatory Visit (INDEPENDENT_AMBULATORY_CARE_PROVIDER_SITE_OTHER): Payer: 59

## 2018-01-08 ENCOUNTER — Ambulatory Visit (INDEPENDENT_AMBULATORY_CARE_PROVIDER_SITE_OTHER): Payer: 59 | Admitting: Podiatry

## 2018-01-08 ENCOUNTER — Encounter: Payer: Self-pay | Admitting: Podiatry

## 2018-01-08 DIAGNOSIS — M898X7 Other specified disorders of bone, ankle and foot: Secondary | ICD-10-CM

## 2018-01-08 DIAGNOSIS — Z9889 Other specified postprocedural states: Secondary | ICD-10-CM

## 2018-01-12 NOTE — Progress Notes (Signed)
   Subjective:  Patient presents today status post 1st met cuneiform exostectomy right. DOS: 01/02/18. He states he is doing well with only minimal pain. He reports some bruising of the toes. There are no modifying factors noted. He has been using the CAM boot as directed. Patient is here for further evaluation and treatment.    Past Medical History:  Diagnosis Date  . Allergy   . Hyperlipidemia   . Hypertension   . Strabismus       Objective/Physical Exam Neurovascular status intact.  Skin incisions appear to be well coapted with sutures and staples intact. No sign of infectious process noted. No dehiscence. No active bleeding noted. Moderate edema noted to the surgical extremity.  Radiographic Exam:  Orthopedic hardware and osteotomies sites appear to be stable with routine healing.  Assessment: 1. s/p 1st met cuneiform exostectomy. DOS: 01/02/18   Plan of Care:  1. Patient was evaluated. X-rays reviewed 2. Dressing changed. Keep clean, dry and intact for one week.  3. Post op shoe dispensed. Discontinue using CAM boot.  4. Return to clinic in one week.    Edrick Kins, DPM [facility]  Dr. Edrick Kins, [address]                                        [place], [place] [address]                Office [phone]  Fax [phone]

## 2018-01-15 ENCOUNTER — Ambulatory Visit (INDEPENDENT_AMBULATORY_CARE_PROVIDER_SITE_OTHER): Payer: 59 | Admitting: Podiatry

## 2018-01-15 DIAGNOSIS — Z9889 Other specified postprocedural states: Secondary | ICD-10-CM

## 2018-01-15 DIAGNOSIS — M898X7 Other specified disorders of bone, ankle and foot: Secondary | ICD-10-CM

## 2018-01-17 DIAGNOSIS — L570 Actinic keratosis: Secondary | ICD-10-CM | POA: Diagnosis not present

## 2018-01-17 DIAGNOSIS — C4442 Squamous cell carcinoma of skin of scalp and neck: Secondary | ICD-10-CM | POA: Diagnosis not present

## 2018-01-18 NOTE — Progress Notes (Signed)
   Subjective:  Patient presents today status post 1st met cuneiform exostectomy right. DOS: 01/02/18. He reports some continued numbness and pain in the toes. He denies modifying factors. He denies any new complaints at this time. Patient is here for further evaluation and treatment.    Past Medical History:  Diagnosis Date  . Allergy   . Hyperlipidemia   . Hypertension   . Strabismus       Objective/Physical Exam Neurovascular status intact.  Skin incisions appear to be well coapted with sutures and staples intact. No sign of infectious process noted. No dehiscence. No active bleeding noted. Moderate edema noted to the surgical extremity.   Assessment: 1. s/p 1st met cuneiform exostectomy. DOS: 01/02/18   Plan of Care:  1. Patient was evaluated. 2. Sutures removed.  3. Compression anklet dispensed.  4. Recommended good shoe gear.  5. Return to clinic in 4 weeks.    Edrick Kins, DPM [facility]  Dr. Edrick Kins, [place]                                        [place], [place] [address]                Office [phone]  Fax [phone]

## 2018-02-12 ENCOUNTER — Ambulatory Visit (INDEPENDENT_AMBULATORY_CARE_PROVIDER_SITE_OTHER): Payer: 59 | Admitting: Podiatry

## 2018-02-12 ENCOUNTER — Ambulatory Visit (INDEPENDENT_AMBULATORY_CARE_PROVIDER_SITE_OTHER): Payer: 59

## 2018-02-12 DIAGNOSIS — M67471 Ganglion, right ankle and foot: Secondary | ICD-10-CM | POA: Diagnosis not present

## 2018-02-12 DIAGNOSIS — Z9889 Other specified postprocedural states: Secondary | ICD-10-CM

## 2018-02-12 DIAGNOSIS — M898X7 Other specified disorders of bone, ankle and foot: Secondary | ICD-10-CM

## 2018-02-12 MED ORDER — GABAPENTIN 100 MG PO CAPS: 100.0000 mg | ORAL_CAPSULE | Freq: Three times a day (TID) | ORAL | 0 refills | Status: DC

## 2018-02-12 NOTE — Progress Notes (Signed)
   Subjective:  Patient presents today status post 1st met cuneiform exostectomy right. DOS: 01/02/18. He reports shooting pain at the 2nd toe. He reports associated numbness. He reports wearing regular shoes for the past three weeks. There are no modifying factors noted. He has been taking Meloxicam for pain relief. Patient is here for further evaluation and treatment.    Past Medical History:  Diagnosis Date  . Allergy   . Hyperlipidemia   . Hypertension   . Strabismus       Objective/Physical Exam Neurovascular status intact.  Skin incisions appear to be well coapted. No sign of infectious process noted. No dehiscence. No active bleeding noted. Moderate edema noted to the surgical extremity.  Radiographic Exam:  Orthopedic hardware and osteotomies sites appear to be stable with routine healing.   Assessment: 1. s/p 1st met cuneiform exostectomy. DOS: 01/02/18 2. Neuritis right foot    Plan of Care:  1. Patient was evaluated. X-Rays reviewed.  2. Continued taking Meloxicam daily.  3. Prescription for Gabapentin 100 mg three times daily provided to patient.  4. Continue wearing good shoe gear.  5. Return to clinic in 4 weeks.     Edrick Kins, DPM [facility]  Dr. Edrick Kins, [address]                                        [place], [place] [address]                Office [phone]  Fax [phone]

## 2018-02-17 DIAGNOSIS — Z85828 Personal history of other malignant neoplasm of skin: Secondary | ICD-10-CM | POA: Diagnosis not present

## 2018-02-17 DIAGNOSIS — L570 Actinic keratosis: Secondary | ICD-10-CM | POA: Diagnosis not present

## 2018-02-17 DIAGNOSIS — L819 Disorder of pigmentation, unspecified: Secondary | ICD-10-CM | POA: Diagnosis not present

## 2018-02-21 ENCOUNTER — Encounter: Payer: 59 | Admitting: Family Medicine

## 2018-03-06 ENCOUNTER — Other Ambulatory Visit: Payer: Self-pay

## 2018-03-06 MED ORDER — MELOXICAM 15 MG PO TABS: 15.0000 mg | ORAL_TABLET | Freq: Every day | ORAL | 0 refills | Status: DC

## 2018-03-12 ENCOUNTER — Other Ambulatory Visit: Payer: Self-pay

## 2018-03-12 ENCOUNTER — Ambulatory Visit (INDEPENDENT_AMBULATORY_CARE_PROVIDER_SITE_OTHER): Payer: 59

## 2018-03-12 ENCOUNTER — Encounter: Payer: Self-pay | Admitting: Podiatry

## 2018-03-12 ENCOUNTER — Ambulatory Visit (INDEPENDENT_AMBULATORY_CARE_PROVIDER_SITE_OTHER): Payer: 59 | Admitting: Podiatry

## 2018-03-12 DIAGNOSIS — M779 Enthesopathy, unspecified: Secondary | ICD-10-CM | POA: Diagnosis not present

## 2018-03-12 DIAGNOSIS — M67471 Ganglion, right ankle and foot: Secondary | ICD-10-CM

## 2018-03-12 DIAGNOSIS — M778 Other enthesopathies, not elsewhere classified: Secondary | ICD-10-CM

## 2018-03-12 MED ORDER — GABAPENTIN 100 MG PO CAPS: 100.0000 mg | ORAL_CAPSULE | Freq: Three times a day (TID) | ORAL | 0 refills | Status: DC

## 2018-03-16 NOTE — Progress Notes (Signed)
   HPI: 62 year old male presenting today with a chief complaint of pain to the dorsum of the right foot that has been present for the past several weeks. He believes he may have arthritis. He has been taking Meloxicam for treatment. There are no modifying factors noted. Patient is here for further evaluation and treatment.   Past Medical History:  Diagnosis Date  . Allergy   . Hyperlipidemia   . Hypertension   . Strabismus      Physical Exam: General: The patient is alert and oriented x3 in no acute distress.  Dermatology: Skin is warm, dry and supple bilateral lower extremities. Negative for open lesions or macerations.  Vascular: Palpable pedal pulses bilaterally. No edema or erythema noted. Capillary refill within normal limits.  Neurological: Epicritic and protective threshold grossly intact bilaterally.   Musculoskeletal Exam: Pain with palpation to the dorsal right midfoot. Range of motion within normal limits to all pedal and ankle joints bilateral. Muscle strength 5/5 in all groups bilateral.   Radiographic Exam:  Normal osseous mineralization. Joint spaces preserved. No fracture/dislocation/boney destruction.    Assessment: 1. Midfoot capsulitis right    Plan of Care:  1. Patient evaluated. X-Rays reviewed.  2. Injection of 0.5 mLs Celestone Soluspan injected into the right midfoot.  3. Appointment with Liliane Channel for custom molded orthotics.  4. Return to clinic in 4 weeks.       Edrick Kins, DPM [facility]  Dr. Edrick Kins, DPM    [address], [place] [address]                Office [phone]  Fax [phone]

## 2018-03-24 ENCOUNTER — Encounter: Payer: Self-pay | Admitting: Family Medicine

## 2018-03-24 ENCOUNTER — Ambulatory Visit (INDEPENDENT_AMBULATORY_CARE_PROVIDER_SITE_OTHER): Payer: 59 | Admitting: Family Medicine

## 2018-03-24 VITALS — BP 142/88 | HR 62 | Temp 98.0°F | Resp 18 | Ht 71.0 in | Wt 184.0 lb

## 2018-03-24 DIAGNOSIS — Z1159 Encounter for screening for other viral diseases: Secondary | ICD-10-CM

## 2018-03-24 DIAGNOSIS — Z23 Encounter for immunization: Secondary | ICD-10-CM

## 2018-03-24 DIAGNOSIS — I10 Essential (primary) hypertension: Secondary | ICD-10-CM

## 2018-03-24 DIAGNOSIS — Z125 Encounter for screening for malignant neoplasm of prostate: Secondary | ICD-10-CM | POA: Diagnosis not present

## 2018-03-24 DIAGNOSIS — Z0000 Encounter for general adult medical examination without abnormal findings: Secondary | ICD-10-CM

## 2018-03-24 DIAGNOSIS — E7800 Pure hypercholesterolemia, unspecified: Secondary | ICD-10-CM

## 2018-03-24 MED ORDER — CELECOXIB 200 MG PO CAPS: 200.0000 mg | ORAL_CAPSULE | Freq: Two times a day (BID) | ORAL | 1 refills | Status: DC

## 2018-03-24 NOTE — Addendum Note (Signed)
Addended by: Shary Decamp B on: 03/24/2018 04:20 PM   Modules accepted: Orders

## 2018-03-24 NOTE — Progress Notes (Signed)
Subjective:    Patient ID: Daniel Rios, male    DOB: 03-16-1956, 62 y.o.   MRN: [id number]  HPI  Patient is here today for complete physical exam.  Patient's only concern is pain in his hands and his feet which he attributes to arthritis.  He has been working with podiatrist and has had surgery on his right foot however the pain has not improved.  The pain is located on the dorsum of the midfoot and is worse with bearing weight or working on his hands and knees installing floor.  Anything that involves bending or flexing his MTP joints elicits the pain.  He is taken Advil with some relief.  He also reports pain and stiffness in his hands particular in the morning that gets better throughout the day.  Otherwise he is doing well.  He is due for his flu shot.  He is due for a tetanus shot.  He is due for hepatitis C screening.  He declines HIV screening.  He is due for a PSA to screen for prostate cancer.  His last colonoscopy was performed in 2016 and is up-to-date  Past Medical History:  Diagnosis Date  . Allergy   . Hyperlipidemia   . Hypertension   . Strabismus    Past Surgical History:  Procedure Laterality Date  . COLONOSCOPY  11-01-2003   normal   Current Outpatient Medications on File Prior to Visit  Medication Sig Dispense Refill  . aspirin 81 MG tablet Take 81 mg by mouth daily.    . cetirizine (ZYRTEC) 10 MG tablet Take 10 mg by mouth daily.    Marland Kitchen gabapentin (NEURONTIN) 100 MG capsule Take 1 capsule (100 mg total) by mouth 3 (three) times daily. 90 capsule 0  . losartan (COZAAR) 100 MG tablet Take 1 tablet (100 mg total) by mouth daily. 90 tablet 3  . meloxicam (MOBIC) 15 MG tablet Take 1 tablet (15 mg total) by mouth daily. 30 tablet 0  . Multiple Vitamin (MULTIVITAMIN) tablet Take 1 tablet by mouth daily. One a day 50+    . oxyCODONE-acetaminophen (PERCOCET/ROXICET) 5-325 MG tablet Take 1 tablet by mouth every 4 (four) hours as needed. for pain  0  . rosuvastatin (CRESTOR)  20 MG tablet Take 1 tablet (20 mg total) by mouth daily. 90 tablet 1   No current facility-administered medications on file prior to visit.    Allergies  Allergen Reactions  . Hydrochlorothiazide Rash   Social History   Socioeconomic History  . Marital status: Unknown    Spouse name: Not on file  . Number of children: Not on file  . Years of education: Not on file  . Highest education level: Not on file  Occupational History  . Not on file  Social Needs  . Financial resource strain: Not on file  . Food insecurity:    Worry: Not on file    Inability: Not on file  . Transportation needs:    Medical: Not on file    Non-medical: Not on file  Tobacco Use  . Smoking status: Former Research scientist (life sciences)  . Smokeless tobacco: Never Used  Substance and Sexual Activity  . Alcohol use: No  . Drug use: No  . Sexual activity: Not on file    Comment: married, quit smoking in 1982.    Lifestyle  . Physical activity:    Days per week: Not on file    Minutes per session: Not on file  . Stress: Not on file  Relationships  . Social connections:    Talks on phone: Not on file    Gets together: Not on file    Attends religious service: Not on file    Active member of club or organization: Not on file    Attends meetings of clubs or organizations: Not on file    Relationship status: Not on file  . Intimate partner violence:    Fear of current or ex partner: Not on file    Emotionally abused: Not on file    Physically abused: Not on file    Forced sexual activity: Not on file  Other Topics Concern  . Not on file  Social History Narrative  . Not on file     Review of Systems  All other systems reviewed and are negative.      Objective:   Physical Exam  Constitutional: He is oriented to person, place, and time. He appears well-developed and well-nourished. No distress.  HENT:  Head: Normocephalic and atraumatic.  Right Ear: External ear normal.  Left Ear: External ear normal.  Nose:  Nose normal.  Mouth/Throat: Oropharynx is clear and moist. No oropharyngeal exudate.  Eyes: Pupils are equal, round, and reactive to light. Conjunctivae and EOM are normal. Right eye exhibits no discharge. Left eye exhibits no discharge. No scleral icterus.  Neck: Normal range of motion. Neck supple. No JVD present. No tracheal deviation present. No thyromegaly present.  Cardiovascular: Normal rate, regular rhythm, normal heart sounds and intact distal pulses. Exam reveals no gallop and no friction rub.  No murmur heard. Pulmonary/Chest: Effort normal and breath sounds normal. No stridor. No respiratory distress. He has no wheezes. He has no rales. He exhibits no tenderness.  Abdominal: Soft. Bowel sounds are normal. He exhibits no distension and no mass. There is no tenderness. There is no rebound and no guarding.  Musculoskeletal: Normal range of motion. He exhibits no edema or tenderness.  Lymphadenopathy:    He has no cervical adenopathy.  Neurological: He is alert and oriented to person, place, and time. He has normal reflexes. No cranial nerve deficit. He exhibits normal muscle tone. Coordination normal.  Skin: Skin is warm. No rash noted. He is not diaphoretic. No erythema. No pallor.  Psychiatric: He has a normal mood and affect. His behavior is normal. Judgment and thought content normal.  Vitals reviewed.         Assessment & Plan:  Routine general medical examination at a health care facility  Benign essential HTN  Pure hypercholesterolemia  Patient's blood pressure today is slightly elevated.  He states at home his systolic blood pressure has been as high as 150.  He has noticed this since he discontinued Benicar and switch to losartan.  He would like to switch back to Benicar if it is available at his pharmacy.  I have recommended adding amlodipine to his losartan if Benicar is not available.  He will check with his pharmacy and then let me know.  We can treat the arthritis  in his hands and his feet with Celebrex 200 mg twice daily.  I counseled the patient about the potential risk to his heart and his kidneys.  I recommend he drink plenty of water with this.  Colonoscopy is up-to-date.  I will screen for prostate cancer with a PSA.  He received his flu shot.  He received his Tdap.  I will screen his cholesterol with a CMP and a fasting lipid panel

## 2018-03-25 ENCOUNTER — Other Ambulatory Visit: Payer: Self-pay | Admitting: Family Medicine

## 2018-03-25 LAB — HEPATITIS C ANTIBODY
Hepatitis C Ab: NONREACTIVE
SIGNAL TO CUT-OFF: 0.01 (ref <1.00)

## 2018-03-25 LAB — CBC WITH DIFFERENTIAL/PLATELET
Basophils Absolute: 39 cells/uL (ref 0–200)
Basophils Relative: 0.7 %
Eosinophils Absolute: 72 cells/uL (ref 15–500)
Eosinophils Relative: 1.3 %
HCT: 49.6 % (ref 38.5–50.0)
Hemoglobin: 16.6 g/dL (ref 13.2–17.1)
Lymphs Abs: 1991 cells/uL (ref 850–3900)
MCH: 31.9 pg (ref 27.0–33.0)
MCHC: 33.5 g/dL (ref 32.0–36.0)
MCV: 95.2 fL (ref 80.0–100.0)
MPV: 9.5 fL (ref 7.5–12.5)
Monocytes Relative: 10.5 %
Neutro Abs: 2822 cells/uL (ref 1500–7800)
Neutrophils Relative %: 51.3 %
Platelets: 310 10*3/uL (ref 140–400)
RBC: 5.21 10*6/uL (ref 4.20–5.80)
RDW: 12.3 % (ref 11.0–15.0)
Total Lymphocyte: 36.2 %
WBC mixed population: 578 cells/uL (ref 200–950)
WBC: 5.5 10*3/uL (ref 3.8–10.8)

## 2018-03-25 LAB — COMPLETE METABOLIC PANEL WITH GFR
AG Ratio: 1.4 (calc) (ref 1.0–2.5)
ALT: 33 U/L (ref 9–46)
AST: 34 U/L (ref 10–35)
Albumin: 4.2 g/dL (ref 3.6–5.1)
Alkaline phosphatase (APISO): 63 U/L (ref 40–115)
BUN: 15 mg/dL (ref 7–25)
CO2: 28 mmol/L (ref 20–32)
Calcium: 9.8 mg/dL (ref 8.6–10.3)
Chloride: 102 mmol/L (ref 98–110)
Creat: 1.08 mg/dL (ref 0.70–1.25)
GFR, Est African American: 84 mL/min/{1.73_m2} (ref >=60)
GFR, Est Non African American: 72 mL/min/{1.73_m2} (ref >=60)
Globulin: 3.1 g/dL (calc) (ref 1.9–3.7)
Glucose, Bld: 104 mg/dL — ABNORMAL HIGH (ref 65–99)
Potassium: 5.2 mmol/L (ref 3.5–5.3)
Sodium: 139 mmol/L (ref 135–146)
Total Bilirubin: 0.7 mg/dL (ref 0.2–1.2)
Total Protein: 7.3 g/dL (ref 6.1–8.1)

## 2018-03-25 LAB — LIPID PANEL
Cholesterol: 167 mg/dL (ref <200)
HDL: 50 mg/dL (ref >40)
LDL Cholesterol (Calc): 103 mg/dL (calc) — ABNORMAL HIGH
Non-HDL Cholesterol (Calc): 117 mg/dL (calc) (ref <130)
Total CHOL/HDL Ratio: 3.3 (calc) (ref <5.0)
Triglycerides: 56 mg/dL (ref <150)

## 2018-03-25 LAB — PSA: PSA: 1.5 ng/mL (ref <=4.0)

## 2018-03-25 MED ORDER — AMLODIPINE BESYLATE 5 MG PO TABS: 5.0000 mg | ORAL_TABLET | Freq: Every day | ORAL | 3 refills | Status: DC

## 2018-04-07 ENCOUNTER — Telehealth: Payer: Self-pay | Admitting: Family Medicine

## 2018-04-07 ENCOUNTER — Other Ambulatory Visit: Payer: Self-pay | Admitting: Family Medicine

## 2018-04-07 DIAGNOSIS — T50905A Adverse effect of unspecified drugs, medicaments and biological substances, initial encounter: Secondary | ICD-10-CM | POA: Diagnosis not present

## 2018-04-07 DIAGNOSIS — R600 Localized edema: Secondary | ICD-10-CM | POA: Diagnosis not present

## 2018-04-07 NOTE — Telephone Encounter (Signed)
Patient called in today with c/o facing swelling. Patient stated that he woke up this morning with swelling to face on both sides in his cheek region. He states that he feels fine other than the swelling and he just noticed symptoms this morning. He denies eating anything out of the ordinary but has concerns symptoms could be coming from his amlodipine. Due to no appointments today I advised patient to go to Urgent care. Patient just wanted to know if you believe this could be coming from his medications. Please advise?

## 2018-04-15 ENCOUNTER — Other Ambulatory Visit: Payer: Self-pay | Admitting: Family Medicine

## 2018-04-15 ENCOUNTER — Encounter: Payer: Self-pay | Admitting: Family Medicine

## 2018-04-16 ENCOUNTER — Encounter: Payer: Self-pay | Admitting: Family Medicine

## 2018-04-16 ENCOUNTER — Ambulatory Visit: Payer: 59 | Admitting: Family Medicine

## 2018-04-16 VITALS — BP 130/80 | HR 59 | Temp 98.5°F | Ht 71.0 in | Wt 187.0 lb

## 2018-04-16 DIAGNOSIS — R300 Dysuria: Secondary | ICD-10-CM | POA: Diagnosis not present

## 2018-04-16 DIAGNOSIS — R309 Painful micturition, unspecified: Secondary | ICD-10-CM | POA: Diagnosis not present

## 2018-04-16 LAB — URINALYSIS, ROUTINE W REFLEX MICROSCOPIC
Bacteria, UA: NONE SEEN /HPF
Bilirubin Urine: NEGATIVE
Glucose, UA: NEGATIVE
Ketones, ur: NEGATIVE
Leukocytes, UA: NEGATIVE
Nitrite: NEGATIVE
Protein, ur: NEGATIVE
Specific Gravity, Urine: 1.01 (ref 1.001–1.03)
Squamous Epithelial / LPF: NONE SEEN /HPF (ref <=5)
WBC, UA: NONE SEEN /HPF (ref 0–5)
pH: 6 (ref 5.0–8.0)

## 2018-04-16 LAB — MICROSCOPIC MESSAGE

## 2018-04-16 MED ORDER — TAMSULOSIN HCL 0.4 MG PO CAPS: ORAL_CAPSULE | ORAL | 0 refills | Status: DC

## 2018-04-16 MED ORDER — CIPROFLOXACIN HCL 500 MG PO TABS: 500.0000 mg | ORAL_TABLET | Freq: Two times a day (BID) | ORAL | 0 refills | Status: DC

## 2018-04-16 NOTE — Progress Notes (Signed)
Patient ID: Daniel Rios, male    DOB: [birth year], 62 y.o.   MRN: [id number]  PCP: Susy Frizzle, MD  Chief Complaint  Patient presents with  . Urinary Frequency    Patient has c/o urinary frequency, and pain.Onset 1 week    Subjective:   Daniel Rios is a 62 y.o. male, presents to clinic with CC of urinary sx x 1 week, severe and gradually worsening.  Endorses dysuria, straining to initiate urine stream, urinary frequency, urinary urgency, dribbling, double voiding, with lower volume of urine with each menstruation.  Times have been severe, frequent, he goes to the bathroom 20 times a day, symptoms have been ongoing for 1 week, gradually worsening.  Pain is severe, described as burning and a tearing sensation going from the base of his penis to the tip of his penis recurs and worsens with urination and immediately after. He has no history of BPH, prostatitis, UTIs, STIs, kidney stones.   He states that he has had similar symptoms in the past that have come and gone, he has waited before with several days of symptoms but by the time he felt like he should have an appointment and get evaluated the symptoms spontaneously resolved.  This time they did not. He denies any flank pain, abdominal pain, nausea, vomiting, sweats, fever, decreased appetite, weight loss, fatigue.  He has been sexually active with one partner for 38 years.  He denies any penile discharge, urethral/meatus irritation or erythema, denies testicular pain, scrotal swelling, genital rash or lesions. No PSA done in the past that the pt knows of  Patient Active Problem List   Diagnosis Date Noted  . Strabismus   . Hyperlipidemia   . Hypertension      Prior to Admission medications   Medication Sig Start Date End Date Taking? Authorizing Provider  amLODipine (NORVASC) 5 MG tablet Take 1 tablet (5 mg total) by mouth daily. 03/25/18  Yes Susy Frizzle, MD  aspirin 81 MG tablet Take 81 mg by mouth daily.   Yes  [provider]  cetirizine (ZYRTEC) 10 MG tablet Take 10 mg by mouth daily.   Yes [provider]  Multiple Vitamin (MULTIVITAMIN) tablet Take 1 tablet by mouth daily. One a day 50+   Yes [provider]  rosuvastatin (CRESTOR) 20 MG tablet Take 1 tablet (20 mg total) by mouth daily. 12/30/17  Yes Susy Frizzle, MD  celecoxib (CELEBREX) 200 MG capsule TAKE 1 CAPSULE BY MOUTH TWICE A DAY Patient not taking: Reported on 04/16/2018 04/15/18   Susy Frizzle, MD  losartan (COZAAR) 100 MG tablet Take 1 tablet (100 mg total) by mouth daily. Patient not taking: Reported on 04/16/2018 10/17/17   Susy Frizzle, MD     Allergies  Allergen Reactions  . Hydrochlorothiazide Rash     Family History  Problem Relation Age of Onset  . Arthritis Mother   . Cancer Father        had metas.all over when found-? starting point   . COPD Brother   . Colon cancer Maternal Aunt      Social History   Socioeconomic History  . Marital status: Unknown    Spouse name: Not on file  . Number of children: Not on file  . Years of education: Not on file  . Highest education level: Not on file  Occupational History  . Not on file  Social Needs  . Financial resource strain: Not on file  .  Food insecurity:    Worry: Not on file    Inability: Not on file  . Transportation needs:    Medical: Not on file    Non-medical: Not on file  Tobacco Use  . Smoking status: Former Research scientist (life sciences)  . Smokeless tobacco: Never Used  Substance and Sexual Activity  . Alcohol use: No  . Drug use: No  . Sexual activity: Not on file    Comment: married, quit smoking in 1982.    Lifestyle  . Physical activity:    Days per week: Not on file    Minutes per session: Not on file  . Stress: Not on file  Relationships  . Social connections:    Talks on phone: Not on file    Gets together: Not on file    Attends religious service: Not on file    Active member of club or organization: Not on  file    Attends meetings of clubs or organizations: Not on file    Relationship status: Not on file  . Intimate partner violence:    Fear of current or ex partner: Not on file    Emotionally abused: Not on file    Physically abused: Not on file    Forced sexual activity: Not on file  Other Topics Concern  . Not on file  Social History Narrative  . Not on file     Review of Systems  Constitutional: Negative.  Negative for activity change, appetite change, chills, diaphoresis, fatigue, fever and unexpected weight change.  HENT: Negative.   Eyes: Negative.   Respiratory: Negative.   Cardiovascular: Negative.   Gastrointestinal: Negative.  Negative for abdominal pain, blood in stool, constipation, diarrhea, nausea, rectal pain and vomiting.  Endocrine: Negative.  Negative for polydipsia and polyphagia.  Genitourinary: Negative.   Musculoskeletal: Negative.  Negative for back pain, joint swelling and myalgias.  Skin: Negative.   Allergic/Immunologic: Negative.   Neurological: Negative.  Negative for dizziness, weakness, light-headedness and headaches.  Hematological: Negative.  Negative for adenopathy.  Psychiatric/Behavioral: Negative.   All other systems reviewed and are negative.      Objective:    Vitals:   04/16/18 1005  BP: 130/80  Pulse: (!) 59  Temp: 98.5 F (36.9 C)  TempSrc: Oral  SpO2: 98%  Weight: 187 lb (84.8 kg)  Height: 5\' 11"  (1.803 m)      Physical Exam  Constitutional: He is oriented to person, place, and time. He appears well-developed and well-nourished.  Non-toxic appearance. He does not appear ill. No distress.  HENT:  Head: Normocephalic and atraumatic.  Right Ear: Tympanic membrane, external ear and ear canal normal.  Left Ear: Tympanic membrane, external ear and ear canal normal.  Nose: No mucosal edema or rhinorrhea. Right sinus exhibits no maxillary sinus tenderness and no frontal sinus tenderness. Left sinus exhibits no maxillary sinus  tenderness and no frontal sinus tenderness.  Mouth/Throat: Uvula is midline. No trismus in the jaw. No uvula swelling. No oropharyngeal exudate, posterior oropharyngeal edema or posterior oropharyngeal erythema.  Eyes: Pupils are equal, round, and reactive to light. Conjunctivae, EOM and lids are normal.  Neck: Trachea normal, normal range of motion and phonation normal. Neck supple. No tracheal deviation present.  Cardiovascular: Normal rate, regular rhythm, normal heart sounds, intact distal pulses and normal pulses. Exam reveals no gallop and no friction rub.  No murmur heard. Pulses:      Radial pulses are 2+ on the right side, and 2+ on the left side.  Posterior tibial pulses are 2+ on the right side, and 2+ on the left side.  Pulmonary/Chest: Effort normal and breath sounds normal. No stridor. No respiratory distress. He has no wheezes. He has no rhonchi. He has no rales.  Abdominal: Soft. Normal appearance and bowel sounds are normal. He exhibits no distension and no mass. There is no tenderness. There is no rebound and no guarding.  No CVA tenderness b/l  Genitourinary: Rectum normal and prostate normal. Prostate is not enlarged and not tender.  Genitourinary Comments: DRE performed with chaperone, Ofilia Neas LPN Pt tolerated  Musculoskeletal: Normal range of motion. He exhibits no edema.  Neurological: He is alert and oriented to person, place, and time. Gait normal.  Skin: Skin is warm, dry and intact. Capillary refill takes less than 2 seconds. No rash noted. He is not diaphoretic.  Psychiatric: He has a normal mood and affect. His speech is normal and behavior is normal.  Nursing note and vitals reviewed.     Results for orders placed or performed in visit on 04/16/18  Urinalysis, Routine w reflex microscopic  Result Value Ref Range   Color, Urine YELLOW YELLOW   APPearance CLEAR CLEAR   Specific Gravity, Urine 1.010 1.001 - 1.03   pH 6.0 5.0 - 8.0   Glucose, UA  NEGATIVE NEGATIVE   Bilirubin Urine NEGATIVE NEGATIVE   Ketones, ur NEGATIVE NEGATIVE   Hgb urine dipstick TRACE (A) NEGATIVE   Protein, ur NEGATIVE NEGATIVE   Nitrite NEGATIVE NEGATIVE   Leukocytes, UA NEGATIVE NEGATIVE   WBC, UA NONE SEEN 0 - 5 /HPF   RBC / HPF 0-2 0 - 2 /HPF   Squamous Epithelial / LPF NONE SEEN < OR = 5 /HPF   Bacteria, UA NONE SEEN NONE SEEN /HPF  Microscopic Message  Result Value Ref Range   Note        Assessment & Plan:      ICD-10-CM   1. Dysuria R30.0 Urinalysis, Routine w reflex microscopic    Urine Culture    Patient symptoms concerning for prostatitis, BPH, UTI, less likely to be nephrolithiasis.  urinalysis was pertinent for trace blood and microscopy confirms very few red blood cells present.  Patient did allow for digital rectal exam and there was no prostate tenderness or enlargement, which was slightly surprising, because I thought prostatitis was most likely.  Urine culture added to eval/r/o UTI Low risk STD Tx with cipro x 7 days and flomax Close follow up Pt refused referral to urology and would like to wait first and see if sx improve.   Ciprofloxacin was chosen as antibiotic to cover prostatitis and UTI the best, did discuss side effects of medication.  Flomax was given to help with LUTS, instructed to take at night before bedtime and warned that it may lower blood pressure or cause some reflux of tachycardia, not to be taken and accommodation with his other blood pressure medications.  He had difficulty reviewing his medicines and stating what blood pressure medicines he is currently taking.  He told Jeralyn Ruths different medications when he told me.  He states that he is not taking losartan, Norvasc or Celebrex and that he was given a sample to take.   I did check with Dr. Samella Parr nurse, Lovey Newcomer, who confirms that he was given a sample of bystolic by Dr. Dennard Schaumann who spoke with the pt last week, I do not know the dose.  He was encouraged  to watch his blood pressure and  hold the Flomax if any side effects of hypotension occur.  Follow up in 2 weeks for recheck.  Labs from recent CPE reviewed, pt did have PSA done and it was normal.  Delsa Grana, PA-C 04/16/18 10:16 AM

## 2018-04-17 LAB — URINE CULTURE
MICRO NUMBER:: 91430763
Result:: NO GROWTH
SPECIMEN QUALITY:: ADEQUATE

## 2018-04-30 ENCOUNTER — Encounter: Payer: Self-pay | Admitting: Family Medicine

## 2018-04-30 ENCOUNTER — Ambulatory Visit: Payer: 59 | Admitting: Family Medicine

## 2018-04-30 VITALS — BP 168/98 | HR 54 | Temp 98.7°F | Resp 15 | Wt 192.2 lb

## 2018-04-30 DIAGNOSIS — R399 Unspecified symptoms and signs involving the genitourinary system: Secondary | ICD-10-CM | POA: Diagnosis not present

## 2018-04-30 DIAGNOSIS — R300 Dysuria: Secondary | ICD-10-CM

## 2018-04-30 DIAGNOSIS — I10 Essential (primary) hypertension: Secondary | ICD-10-CM

## 2018-04-30 MED ORDER — NEBIVOLOL HCL 5 MG PO TABS: 5.0000 mg | ORAL_TABLET | Freq: Every day | ORAL | 2 refills | Status: DC

## 2018-04-30 MED ORDER — TAMSULOSIN HCL 0.4 MG PO CAPS: ORAL_CAPSULE | ORAL | 1 refills | Status: DC

## 2018-04-30 NOTE — Patient Instructions (Addendum)
Allergies as of 04/30/2018      Reactions   Celebrex [celecoxib] Swelling, Rash      Medication List        Accurate as of 04/30/18  9:53 AM. Always use your most recent med list.          amLODipine 5 MG tablet     *HOLD AMLODIPINE/NORVASC FOR THE NEXT 1-2 WEEKS AND MONITOR BP - EITHER CALL WITH BP READINGS OR COME FOR FOLLOW UP VISIT Commonly known as:  NORVASC Take 1 tablet (5 mg total) by mouth daily.   aspirin 81 MG tablet Take 81 mg by mouth daily.   cetirizine 10 MG tablet Commonly known as:  ZYRTEC Take 10 mg by mouth daily.   ciprofloxacin 500 MG tablet Commonly known as:  CIPRO Take 1 tablet (500 mg total) by mouth 2 (two) times daily.   losartan 100 MG tablet    Commonly known as:  COZAAR Take 1 tablet (100 mg total) by mouth daily.   multivitamin tablet Take 1 tablet by mouth daily. One a day 50+   nebivolol 5 MG tablet Commonly known as:  BYSTOLIC Take 1 tablet (5 mg total) by mouth daily.   rosuvastatin 20 MG tablet Commonly known as:  CRESTOR Take 1 tablet (20 mg total) by mouth daily.   tamsulosin 0.4 MG Caps capsule Commonly known as:  FLOMAX Take 1 capsule PO daily at bedtime for urinary sx Start taking on:  05/12/2018  (DATE THAT REFILL IS AVAILABLE)          See the info below -   Some modifications that can help symptoms: Behavioral modification may be helpful for all patients. For example, patients may benefit from voiding in the sitting position (rather than standing).  Other behavioral modifications include: ?Avoiding fluids prior to bedtime or before going out ?Reducing consumption of mild diuretics such as caffeine and alcohol ?Double voiding to empty the bladder more completely  You can continue to take the    Benign Prostatic Hyperplasia Benign prostatic hyperplasia (BPH) is an enlarged prostate gland that is caused by the normal aging process and not by cancer. The prostate is a walnut-sized gland that is involved in the  production of semen. It is located in front of the rectum and below the bladder. The bladder stores urine and the urethra is the tube that carries the urine out of the body. The prostate may get bigger as a man gets older. An enlarged prostate can press on the urethra. This can make it harder to pass urine. The build-up of urine in the bladder can cause infection. Back pressure and infection may progress to bladder damage and kidney (renal) failure. What are the causes? This condition is part of a normal aging process. However, not all men develop problems from this condition. If the prostate enlarges away from the urethra, urine flow will not be blocked. If it enlarges toward the urethra and compresses it, there will be problems passing urine. What increases the risk? This condition is more likely to develop in men over the age of 51 years. What are the signs or symptoms? Symptoms of this condition include:  Getting up often during the night to urinate.  Needing to urinate frequently during the day.  Difficulty starting urine flow.  Decrease in size and strength of your urine stream.  Leaking (dribbling) after urinating.  Inability to pass urine. This needs immediate treatment.  Inability to completely empty your bladder.  Pain when  you pass urine. This is more common if there is also an infection.  Urinary tract infection (UTI).  How is this diagnosed? This condition is diagnosed based on your medical history, a physical exam, and your symptoms. Tests will also be done, such as:  A post-void bladder scan. This measures any amount of urine that may remain in your bladder after you finish urinating.  A digital rectal exam. In a rectal exam, your health care provider checks your prostate by putting a lubricated, gloved finger into your rectum to feel the back of your prostate gland. This exam detects the size of your gland and any abnormal lumps or growths.  An exam of your urine  (urinalysis).  A prostate specific antigen (PSA) screening. This is a blood test used to screen for prostate cancer.  An ultrasound. This test uses sound waves to electronically produce a picture of your prostate gland.  Your health care provider may refer you to a specialist in kidney and prostate diseases (urologist). How is this treated? Once symptoms begin, your health care provider will monitor your condition (active surveillance or watchful waiting). Treatment for this condition will depend on the severity of your condition. Treatment may include:  Observation and yearly exams. This may be the only treatment needed if your condition and symptoms are mild.  Medicines to relieve your symptoms, including: ? Medicines to shrink the prostate. ? Medicines to relax the muscle of the prostate.  Surgery in severe cases. Surgery may include: ? Prostatectomy. In this procedure, the prostate tissue is removed completely through an open incision or with a laparascope or robotics. ? Transurethral resection of the prostate (TURP). In this procedure, a tool is inserted through the opening at the tip of the penis (urethra). It is used to cut away tissue of the inner core of the prostate. The pieces are removed through the same opening of the penis. This removes the blockage. ? Transurethral incision (TUIP). In this procedure, small cuts are made in the prostate. This lessens the prostate's pressure on the urethra. ? Transurethral microwave thermotherapy (TUMT). This procedure uses microwaves to create heat. The heat destroys and removes a small amount of prostate tissue. ? Transurethral needle ablation (TUNA). This procedure uses radio frequencies to destroy and remove a small amount of prostate tissue. ? Interstitial laser coagulation ([place]). This procedure uses a laser to destroy and remove a small amount of prostate tissue. ? Transurethral electrovaporization (TUVP). This procedure uses electrodes to  destroy and remove a small amount of prostate tissue. ? Prostatic urethral lift. This procedure inserts an implant to push the lobes of the prostate away from the urethra.  Follow these instructions at home:  Take over-the-counter and prescription medicines only as told by your health care provider.  Monitor your symptoms for any changes. Contact your health care provider with any changes.  Avoid drinking large amounts of liquid before going to bed or out in public.  Avoid or reduce how much caffeine or alcohol you drink.  Give yourself time when you urinate.  Keep all follow-up visits as told by your health care provider. This is important. Contact a health care provider if:  You have unexplained back pain.  Your symptoms do not get better with treatment.  You develop side effects from the medicine you are taking.  Your urine becomes very dark or has a bad smell.  Your lower abdomen becomes distended and you have trouble passing your urine. Get help right away if:  You have a fever or chills.  You suddenly cannot urinate.  You feel lightheaded, or very dizzy, or you faint.  There are large amounts of blood or clots in the urine.  Your urinary problems become hard to manage.  You develop moderate to severe low back or flank pain. The flank is the side of your body between the ribs and the hip. These symptoms may represent a serious problem that is an emergency. Do not wait to see if the symptoms will go away. Get medical help right away. Call your local emergency services (911 in the [place] Do not drive yourself to the hospital. Summary  Benign prostatic hyperplasia (BPH) is an enlarged prostate that is caused by the normal aging process and not by cancer.  An enlarged prostate can press on the urethra. This can make it hard to pass urine.  This condition is part of a normal aging process and is more likely to develop in men over the age of 23 years.  Get help right  away if you suddenly cannot urinate. This information is not intended to replace advice given to you by your health care provider. Make sure you discuss any questions you have with your health care provider. Document Released: 05/07/2005 Document Revised: 06/11/2016 Document Reviewed: 06/11/2016 Elsevier Interactive Patient Education  [employer].

## 2018-04-30 NOTE — Progress Notes (Signed)
Patient ID: Daniel Rios, male    DOB: September 15, 1955, 62 y.o.   MRN: [id number]  PCP: Susy Frizzle, MD  Chief Complaint  Patient presents with  . Dysuria    Patient in states symptoms    Subjective:   Daniel Rios is a 62 y.o. male, presents to clinic with CC of urinary sx recheck, he completed the antibiotics and Flomax has been helpful for all of his lower urinary tract symptoms he states that he has been able to urinate easier than he has in "10 years."  Today he also states that 10 years ago his past PCP diagnosed him with BPH.  He would like to continue Flomax and asked how long he can take this for.    He also inquires about his blood pressure medications.  Last visit there was some confusion about what he was taking because he was holding multiple medications for possible allergic reaction.  He believes that Celebrex had caused a rash and some swelling, he has discontinued this medication.  He was off of it for several weeks and yesterday decided to try Celebrex and he had read rash and swelling to his face cheeks and around his eyes yesterday so he is fairly certain he is allergic to Celebrex.  We have updated this on his allergy list and he will discontinue this medication.   Patient also tried taking losartan and amlodipine again, however is not taking it regularly, these did not cause a rash.  He is currently only taking Bystolic 5 mg daily in the morning.  His blood pressure is elevated at home it is been running in systolic 196Q.  He has not had any chest pain, shortness of breath, headache, visual disturbances, near-syncope, palpitations, lower extremity edema.      Patient Active Problem List   Diagnosis Date Noted  . Strabismus   . Hyperlipidemia   . Hypertension      Prior to Admission medications   Medication Sig Start Date End Date Taking? Authorizing Provider  aspirin 81 MG tablet Take 81 mg by mouth daily.   Yes [provider]  cetirizine  (ZYRTEC) 10 MG tablet Take 10 mg by mouth daily.   Yes [provider]  losartan (COZAAR) 100 MG tablet Take 1 tablet (100 mg total) by mouth daily. 10/17/17  Yes Susy Frizzle, MD  Multiple Vitamin (MULTIVITAMIN) tablet Take 1 tablet by mouth daily. One a day 50+   Yes [provider]  Nebivolol HCl (BYSTOLIC PO) Take by mouth.   Yes [provider]  rosuvastatin (CRESTOR) 20 MG tablet Take 1 tablet (20 mg total) by mouth daily. 12/30/17  Yes Susy Frizzle, MD  tamsulosin (FLOMAX) 0.4 MG CAPS capsule Take 1 capsule PO daily at bedtime for urinary sx 05/12/18  Yes Delsa Grana, PA-C  amLODipine (NORVASC) 5 MG tablet Take 1 tablet (5 mg total) by mouth daily. Patient not taking: Reported on 04/16/2018 03/25/18   Susy Frizzle, MD  ciprofloxacin (CIPRO) 500 MG tablet Take 1 tablet (500 mg total) by mouth 2 (two) times daily. 04/16/18   Delsa Grana, PA-C     Allergies  Allergen Reactions  . Celebrex [Celecoxib] Swelling and Rash     Family History  Problem Relation Age of Onset  . Arthritis Mother   . Cancer Father        had metas.all over when found-? starting point   . COPD Brother   . Colon cancer  Maternal Aunt      Social History   Socioeconomic History  . Marital status: Unknown    Spouse name: Not on file  . Number of children: Not on file  . Years of education: Not on file  . Highest education level: Not on file  Occupational History  . Not on file  Social Needs  . Financial resource strain: Not on file  . Food insecurity:    Worry: Not on file    Inability: Not on file  . Transportation needs:    Medical: Not on file    Non-medical: Not on file  Tobacco Use  . Smoking status: Former Research scientist (life sciences)  . Smokeless tobacco: Never Used  Substance and Sexual Activity  . Alcohol use: No  . Drug use: No  . Sexual activity: Not on file    Comment: married, quit smoking in 1982.    Lifestyle  . Physical activity:    Days per week: Not on  file    Minutes per session: Not on file  . Stress: Not on file  Relationships  . Social connections:    Talks on phone: Not on file    Gets together: Not on file    Attends religious service: Not on file    Active member of club or organization: Not on file    Attends meetings of clubs or organizations: Not on file    Relationship status: Not on file  . Intimate partner violence:    Fear of current or ex partner: Not on file    Emotionally abused: Not on file    Physically abused: Not on file    Forced sexual activity: Not on file  Other Topics Concern  . Not on file  Social History Narrative  . Not on file     Review of Systems  Constitutional: Negative.   HENT: Negative.   Eyes: Negative.  Negative for visual disturbance.  Respiratory: Negative.  Negative for cough, chest tightness and shortness of breath.   Cardiovascular: Negative.  Negative for chest pain, palpitations and leg swelling.  Gastrointestinal: Negative.   Endocrine: Negative.   Genitourinary: Negative.   Musculoskeletal: Negative.   Skin: Negative.   Allergic/Immunologic: Negative.   Neurological: Negative.  Negative for dizziness, weakness, light-headedness and headaches.  Hematological: Negative.   Psychiatric/Behavioral: Negative.   All other systems reviewed and are negative.      Objective:    Vitals:   04/30/18 0927  BP: (!) 168/98  Pulse: (!) 54  Temp: 98.7 F (37.1 C)  TempSrc: Oral  SpO2: 98%  Weight: 192 lb 4 oz (87.2 kg)      Physical Exam  Constitutional: He appears well-developed and well-nourished. No distress.  HENT:  Head: Normocephalic and atraumatic.  Nose: Nose normal.  Mouth/Throat: Oropharynx is clear and moist.  Eyes: Pupils are equal, round, and reactive to light. Conjunctivae are normal. Right eye exhibits no discharge. Left eye exhibits no discharge.  Neck: Normal range of motion. Neck supple. No tracheal deviation present.  Cardiovascular: Normal rate,  regular rhythm, normal heart sounds and intact distal pulses. Exam reveals no gallop and no friction rub.  No murmur heard. Pulmonary/Chest: Effort normal and breath sounds normal. No stridor. No respiratory distress. He has no wheezes. He has no rales.  Abdominal: Soft. Bowel sounds are normal. He exhibits no distension and no mass. There is no tenderness.  Musculoskeletal: Normal range of motion.  Neurological: He is alert. He exhibits normal muscle tone. Coordination  normal.  Skin: Skin is warm and dry. No rash noted. He is not diaphoretic.  Psychiatric: He has a normal mood and affect. His behavior is normal.  Nursing note and vitals reviewed.         Assessment & Plan:      ICD-10-CM   1. Lower urinary tract symptoms (LUTS) R39.9 tamsulosin (FLOMAX) 0.4 MG CAPS capsule    Ambulatory referral to Urology  2. Dysuria R30.0 tamsulosin (FLOMAX) 0.4 MG CAPS capsule  3. Benign essential HTN I10      Patient has completed antibiotics without any adverse effects, he is continued Flomax at night without any side effects he has significantly improved urinary symptoms, no longer has pain frequency straining or dribbling.  He reports a past medical history about 10 years ago of diagnosis of BPH.  We will continue Flomax 0.4 mg daily at bedtime, encourage patient to do a one-time consult with urology and then we could continue his medications however he is slightly hesitant to do this.  His blood pressure was elevated today, asymptomatic do not have any concern for endorgan damage, have discussed his medications and he will restart taking losartan 100 mg daily he was given a prescription refill and 2 more samples of the Bystolic, is not having any side effects with Bystolic no fatigue, exertional symptoms, near syncope.   He does have a blood pressure monitor at home when he was instructed to monitor his blood pressure over the next 2 weeks and call us with blood pressure numbers.  He also was  instructed to come in to be rechecked in 2 weeks if his blood pressure is routinely over 140/90 or if he has any adverse side effects, chest pain shortness of breath exertional symptoms.  We will currently hold amlodipine, since he is on flomax, and see if Bystolic and losartan get his blood pressure at goal.  Delsa Grana, PA-C 04/30/18 9:40 AM

## 2018-06-19 DIAGNOSIS — L821 Other seborrheic keratosis: Secondary | ICD-10-CM | POA: Diagnosis not present

## 2018-06-19 DIAGNOSIS — L570 Actinic keratosis: Secondary | ICD-10-CM | POA: Diagnosis not present

## 2018-06-19 DIAGNOSIS — L578 Other skin changes due to chronic exposure to nonionizing radiation: Secondary | ICD-10-CM | POA: Diagnosis not present

## 2018-06-19 DIAGNOSIS — I788 Other diseases of capillaries: Secondary | ICD-10-CM | POA: Diagnosis not present

## 2018-06-20 ENCOUNTER — Other Ambulatory Visit: Payer: Self-pay | Admitting: Family Medicine

## 2018-11-06 ENCOUNTER — Other Ambulatory Visit: Payer: Self-pay | Admitting: Family Medicine

## 2018-11-06 DIAGNOSIS — R399 Unspecified symptoms and signs involving the genitourinary system: Secondary | ICD-10-CM

## 2018-11-06 DIAGNOSIS — R300 Dysuria: Secondary | ICD-10-CM

## 2018-11-14 ENCOUNTER — Other Ambulatory Visit: Payer: Self-pay | Admitting: Family Medicine

## 2018-12-03 ENCOUNTER — Telehealth: Payer: Self-pay | Admitting: Family Medicine

## 2018-12-03 DIAGNOSIS — R300 Dysuria: Secondary | ICD-10-CM

## 2018-12-03 DIAGNOSIS — R399 Unspecified symptoms and signs involving the genitourinary system: Secondary | ICD-10-CM

## 2018-12-03 MED ORDER — ROSUVASTATIN CALCIUM 20 MG PO TABS: 20.0000 mg | ORAL_TABLET | Freq: Every day | ORAL | 1 refills | Status: DC

## 2018-12-03 MED ORDER — LOSARTAN POTASSIUM 100 MG PO TABS: 100.0000 mg | ORAL_TABLET | Freq: Every day | ORAL | 3 refills | Status: DC

## 2018-12-03 MED ORDER — AMLODIPINE BESYLATE 5 MG PO TABS: 5.0000 mg | ORAL_TABLET | Freq: Every day | ORAL | 3 refills | Status: DC

## 2018-12-03 MED ORDER — TAMSULOSIN HCL 0.4 MG PO CAPS: ORAL_CAPSULE | ORAL | 1 refills | Status: DC

## 2018-12-03 NOTE — Telephone Encounter (Signed)
Medication called/sent to requested pharmacy  

## 2018-12-03 NOTE — Telephone Encounter (Signed)
Pt needs [place] to change his pharmacy to [facility] [address] [place].   Needs refills on norvasc, cozaar, bystolic, crestor, and flomax.

## 2018-12-15 ENCOUNTER — Other Ambulatory Visit: Payer: Self-pay | Admitting: Family Medicine

## 2019-03-23 DIAGNOSIS — L578 Other skin changes due to chronic exposure to nonionizing radiation: Secondary | ICD-10-CM | POA: Diagnosis not present

## 2019-03-23 DIAGNOSIS — L570 Actinic keratosis: Secondary | ICD-10-CM | POA: Diagnosis not present

## 2019-04-07 ENCOUNTER — Other Ambulatory Visit: Payer: Self-pay | Admitting: Family Medicine

## 2019-04-15 ENCOUNTER — Other Ambulatory Visit: Payer: Self-pay

## 2019-04-27 DIAGNOSIS — L570 Actinic keratosis: Secondary | ICD-10-CM | POA: Diagnosis not present

## 2019-04-27 DIAGNOSIS — L819 Disorder of pigmentation, unspecified: Secondary | ICD-10-CM | POA: Diagnosis not present

## 2019-05-11 ENCOUNTER — Other Ambulatory Visit: Payer: Self-pay | Admitting: Family Medicine

## 2019-05-11 DIAGNOSIS — R300 Dysuria: Secondary | ICD-10-CM

## 2019-05-11 DIAGNOSIS — R399 Unspecified symptoms and signs involving the genitourinary system: Secondary | ICD-10-CM

## 2019-05-13 ENCOUNTER — Other Ambulatory Visit: Payer: Self-pay | Admitting: Family Medicine

## 2019-05-13 DIAGNOSIS — R399 Unspecified symptoms and signs involving the genitourinary system: Secondary | ICD-10-CM

## 2019-05-13 DIAGNOSIS — R300 Dysuria: Secondary | ICD-10-CM

## 2019-05-16 ENCOUNTER — Other Ambulatory Visit: Payer: Self-pay | Admitting: Family Medicine

## 2019-05-25 ENCOUNTER — Encounter: Payer: 59 | Admitting: Family Medicine

## 2019-07-01 ENCOUNTER — Other Ambulatory Visit: Payer: Self-pay | Admitting: *Deleted

## 2019-07-01 MED ORDER — LOSARTAN POTASSIUM 100 MG PO TABS: 100.0000 mg | ORAL_TABLET | Freq: Every day | ORAL | 0 refills | Status: DC

## 2019-07-08 ENCOUNTER — Other Ambulatory Visit: Payer: Self-pay | Admitting: Family Medicine

## 2019-07-08 MED ORDER — ROSUVASTATIN CALCIUM 20 MG PO TABS: ORAL_TABLET | ORAL | 0 refills | Status: DC

## 2019-08-11 ENCOUNTER — Other Ambulatory Visit: Payer: Self-pay | Admitting: Family Medicine

## 2019-08-11 ENCOUNTER — Encounter: Payer: Self-pay | Admitting: Gastroenterology

## 2019-08-17 ENCOUNTER — Other Ambulatory Visit: Payer: Self-pay

## 2019-08-17 ENCOUNTER — Ambulatory Visit (INDEPENDENT_AMBULATORY_CARE_PROVIDER_SITE_OTHER): Payer: PPO | Admitting: Family Medicine

## 2019-08-17 ENCOUNTER — Encounter: Payer: Self-pay | Admitting: Family Medicine

## 2019-08-17 VITALS — BP 138/82 | HR 62 | Temp 97.4°F | Resp 16 | Ht 71.0 in | Wt 196.0 lb

## 2019-08-17 DIAGNOSIS — I10 Essential (primary) hypertension: Secondary | ICD-10-CM

## 2019-08-17 DIAGNOSIS — Z0001 Encounter for general adult medical examination with abnormal findings: Secondary | ICD-10-CM

## 2019-08-17 DIAGNOSIS — Z0000 Encounter for general adult medical examination without abnormal findings: Secondary | ICD-10-CM

## 2019-08-17 DIAGNOSIS — Z125 Encounter for screening for malignant neoplasm of prostate: Secondary | ICD-10-CM | POA: Diagnosis not present

## 2019-08-17 MED ORDER — LEVOCETIRIZINE DIHYDROCHLORIDE 5 MG PO TABS: 5.0000 mg | ORAL_TABLET | Freq: Every evening | ORAL | 5 refills | Status: DC

## 2019-08-17 MED ORDER — FLUTICASONE PROPIONATE 50 MCG/ACT NA SUSP: 2.0000 | Freq: Every day | NASAL | 6 refills | Status: DC

## 2019-08-17 NOTE — Progress Notes (Signed)
Subjective:    Patient ID: Daniel Rios, male    DOB: August 25, 1955, 64 y.o.   MRN: [id number]  HPI  Patient is a very pleasant 64 year old Caucasian male here today for Medicare wellness visit.  His blood pressure is well controlled at 138/82.  He denies any falls.  He denies any depression.  He denies any memory loss.  He is due for an HIV test which he politely declines.  He is due for a flu shot per our records however he states that he had a flu shot at an outside clinic this year.  He is due for Pneumovax 23.  He is also due for the COVID-19 vaccination.  We discussed this at length today and the patient wants to defer that at the present time and get Pneumovax 23 instead.  His colonoscopy is due however he is already scheduled an appointment to see his gastroenterologist in May.  He is due for prostate cancer screening.  Otherwise his only concern is he gets occasional orthostatic hypotension since taking Flomax.  He also has seasonal allergies that Zyrtec over-the-counter is not controlling.  He has tried Flonase by itself in the past with no success.  Past Medical History:  Diagnosis Date  . Allergy   . Hyperlipidemia   . Hypertension   . Strabismus    Past Surgical History:  Procedure Laterality Date  . COLONOSCOPY  11-01-2003   normal   Current Outpatient Medications on File Prior to Visit  Medication Sig Dispense Refill  . amLODipine (NORVASC) 5 MG tablet Take 1 tablet (5 mg total) by mouth daily. 90 tablet 3  . aspirin 81 MG tablet Take 81 mg by mouth daily.    Marland Kitchen losartan (COZAAR) 100 MG tablet Take 1 tablet (100 mg total) by mouth daily. 90 tablet 0  . Multiple Vitamin (MULTIVITAMIN) tablet Take 1 tablet by mouth daily. One a day 50+    . rosuvastatin (CRESTOR) 20 MG tablet TAKE 1 TABLET(20 MG) BY MOUTH DAILY (Needs office visit and labs before further refills ) 90 tablet 0  . tamsulosin (FLOMAX) 0.4 MG CAPS capsule TAKE 1 CAPSULE BY MOUTH EVERY NIGHT AT BEDTIME FOR UNINARY  SYMPTOMS 90 capsule 1   No current facility-administered medications on file prior to visit.   Allergies  Allergen Reactions  . Celebrex [Celecoxib] Swelling and Rash   Social History   Socioeconomic History  . Marital status: Unknown    Spouse name: Not on file  . Number of children: Not on file  . Years of education: Not on file  . Highest education level: Not on file  Occupational History  . Not on file  Tobacco Use  . Smoking status: Former Research scientist (life sciences)  . Smokeless tobacco: Never Used  Substance and Sexual Activity  . Alcohol use: No  . Drug use: No  . Sexual activity: Not on file    Comment: married, quit smoking in 1982.    Other Topics Concern  . Not on file  Social History Narrative  . Not on file   Social Determinants of Health   Financial Resource Strain:   . Difficulty of Paying Living Expenses:   Food Insecurity:   . Worried About Charity fundraiser in the Last Year:   . Arboriculturist in the Last Year:   Transportation Needs:   . Film/video editor (Medical):   Marland Kitchen Lack of Transportation (Non-Medical):   Physical Activity:   . Days of Exercise per  Week:   . Minutes of Exercise per Session:   Stress:   . Feeling of Stress :   Social Connections:   . Frequency of Communication with Friends and Family:   . Frequency of Social Gatherings with Friends and Family:   . Attends Religious Services:   . Active Member of Clubs or Organizations:   . Attends Archivist Meetings:   Marland Kitchen Marital Status:   Intimate Partner Violence:   . Fear of Current or Ex-Partner:   . Emotionally Abused:   Marland Kitchen Physically Abused:   . Sexually Abused:      Review of Systems  All other systems reviewed and are negative.      Objective:   Physical Exam  Constitutional: He is oriented to person, place, and time. He appears well-developed and well-nourished. No distress.  HENT:  Head: Normocephalic and atraumatic.  Right Ear: External ear normal.  Left Ear:  External ear normal.  Nose: Nose normal.  Mouth/Throat: Oropharynx is clear and moist. No oropharyngeal exudate.  Eyes: Pupils are equal, round, and reactive to light. Conjunctivae and EOM are normal. Right eye exhibits no discharge. Left eye exhibits no discharge. No scleral icterus.  Neck: No JVD present. No tracheal deviation present. No thyromegaly present.  Cardiovascular: Normal rate, regular rhythm, normal heart sounds and intact distal pulses. Exam reveals no gallop and no friction rub.  No murmur heard. Pulmonary/Chest: Effort normal and breath sounds normal. No stridor. No respiratory distress. He has no wheezes. He has no rales. He exhibits no tenderness.  Abdominal: Soft. Bowel sounds are normal. He exhibits no distension and no mass. There is no abdominal tenderness. There is no rebound and no guarding.  Musculoskeletal:        General: No tenderness or edema. Normal range of motion.     Cervical back: Normal range of motion and neck supple.  Lymphadenopathy:    He has no cervical adenopathy.  Neurological: He is alert and oriented to person, place, and time. He has normal reflexes. No cranial nerve deficit. He exhibits normal muscle tone. Coordination normal.  Skin: Skin is warm. No rash noted. He is not diaphoretic. No erythema. No pallor.  Psychiatric: He has a normal mood and affect. His behavior is normal. Judgment and thought content normal.  Vitals reviewed.         Assessment & Plan:  General medical exam - Plan: PSA  Benign essential HTN - Plan: CBC with Differential/Platelet, COMPLETE METABOLIC PANEL WITH GFR, Lipid panel  Prostate cancer screening - Plan: PSA Blood pressure today is well controlled.  Check CBC, CMP, fasting lipid panel.  Screen for prostate cancer with a PSA.  Colonoscopy has already been scheduled.  Recommended trying Xyzal 5 mg a day and Flonase 2 sprays each nostril daily for seasonal allergies.  I believe orthostatic hypotension is due to  Flomax.  We discussed strategies to help prevent this.  Recommended the Covid vaccination but patient politely declines.  Patient received his pneumonia vaccine today.  Patient politely declines HIV test.  He is already had his flu shot at an outside clinic.

## 2019-08-18 ENCOUNTER — Encounter: Payer: Self-pay | Admitting: Family Medicine

## 2019-08-18 LAB — COMPLETE METABOLIC PANEL WITH GFR
AG Ratio: 1.6 (calc) (ref 1.0–2.5)
ALT: 25 U/L (ref 9–46)
AST: 28 U/L (ref 10–35)
Albumin: 4.4 g/dL (ref 3.6–5.1)
Alkaline phosphatase (APISO): 64 U/L (ref 35–144)
BUN: 17 mg/dL (ref 7–25)
CO2: 27 mmol/L (ref 20–32)
Calcium: 9.9 mg/dL (ref 8.6–10.3)
Chloride: 104 mmol/L (ref 98–110)
Creat: 1.25 mg/dL (ref 0.70–1.25)
GFR, Est African American: 70 mL/min/{1.73_m2} (ref >=60)
GFR, Est Non African American: 60 mL/min/{1.73_m2} (ref >=60)
Globulin: 2.8 g/dL (calc) (ref 1.9–3.7)
Glucose, Bld: 112 mg/dL — ABNORMAL HIGH (ref 65–99)
Potassium: 4.9 mmol/L (ref 3.5–5.3)
Sodium: 139 mmol/L (ref 135–146)
Total Bilirubin: 0.9 mg/dL (ref 0.2–1.2)
Total Protein: 7.2 g/dL (ref 6.1–8.1)

## 2019-08-18 LAB — CBC WITH DIFFERENTIAL/PLATELET
Absolute Monocytes: 657 cells/uL (ref 200–950)
Basophils Absolute: 72 cells/uL (ref 0–200)
Basophils Relative: 1.1 %
Eosinophils Absolute: 124 cells/uL (ref 15–500)
Eosinophils Relative: 1.9 %
HCT: 49.2 % (ref 38.5–50.0)
Hemoglobin: 16.3 g/dL (ref 13.2–17.1)
Lymphs Abs: 1931 cells/uL (ref 850–3900)
MCH: 31.9 pg (ref 27.0–33.0)
MCHC: 33.1 g/dL (ref 32.0–36.0)
MCV: 96.3 fL (ref 80.0–100.0)
MPV: 9.2 fL (ref 7.5–12.5)
Monocytes Relative: 10.1 %
Neutro Abs: 3718 cells/uL (ref 1500–7800)
Neutrophils Relative %: 57.2 %
Platelets: 325 10*3/uL (ref 140–400)
RBC: 5.11 10*6/uL (ref 4.20–5.80)
RDW: 12.5 % (ref 11.0–15.0)
Total Lymphocyte: 29.7 %
WBC: 6.5 10*3/uL (ref 3.8–10.8)

## 2019-08-18 LAB — LIPID PANEL
Cholesterol: 156 mg/dL (ref <200)
HDL: 51 mg/dL (ref >=40)
LDL Cholesterol (Calc): 86 mg/dL (calc)
Non-HDL Cholesterol (Calc): 105 mg/dL (calc) (ref <130)
Total CHOL/HDL Ratio: 3.1 (calc) (ref <5.0)
Triglycerides: 93 mg/dL (ref <150)

## 2019-08-18 LAB — PSA: PSA: 1.2 ng/mL (ref <=4.0)

## 2019-09-09 ENCOUNTER — Other Ambulatory Visit: Payer: Self-pay | Admitting: Family Medicine

## 2019-09-22 ENCOUNTER — Other Ambulatory Visit: Payer: Self-pay

## 2019-09-22 ENCOUNTER — Ambulatory Visit (AMBULATORY_SURGERY_CENTER): Payer: Self-pay

## 2019-09-22 VITALS — Temp 98.0°F | Ht 71.0 in | Wt 198.0 lb

## 2019-09-22 DIAGNOSIS — Z01818 Encounter for other preprocedural examination: Secondary | ICD-10-CM

## 2019-09-22 DIAGNOSIS — Z86010 Personal history of colonic polyps: Secondary | ICD-10-CM

## 2019-09-22 MED ORDER — PEG 3350-KCL-NA BICARB-NACL 420 G PO SOLR: 4000.0000 mL | Freq: Once | ORAL | 0 refills | Status: AC

## 2019-09-22 NOTE — Progress Notes (Signed)

## 2019-09-25 ENCOUNTER — Other Ambulatory Visit: Payer: Self-pay | Admitting: Family Medicine

## 2019-09-28 ENCOUNTER — Encounter: Payer: 59 | Admitting: Gastroenterology

## 2019-10-02 ENCOUNTER — Ambulatory Visit (INDEPENDENT_AMBULATORY_CARE_PROVIDER_SITE_OTHER): Payer: PPO

## 2019-10-02 ENCOUNTER — Other Ambulatory Visit: Payer: Self-pay

## 2019-10-02 ENCOUNTER — Other Ambulatory Visit: Payer: Self-pay | Admitting: Gastroenterology

## 2019-10-02 DIAGNOSIS — Z1159 Encounter for screening for other viral diseases: Secondary | ICD-10-CM

## 2019-10-02 LAB — SARS CORONAVIRUS 2 (TAT 6-24 HRS): SARS Coronavirus 2: NEGATIVE

## 2019-10-06 ENCOUNTER — Ambulatory Visit (AMBULATORY_SURGERY_CENTER): Payer: PPO | Admitting: Gastroenterology

## 2019-10-06 ENCOUNTER — Encounter: Payer: Self-pay | Admitting: Gastroenterology

## 2019-10-06 ENCOUNTER — Other Ambulatory Visit: Payer: Self-pay

## 2019-10-06 VITALS — BP 125/79 | HR 62 | Temp 97.1°F | Resp 14 | Ht 71.0 in | Wt 198.0 lb

## 2019-10-06 DIAGNOSIS — D128 Benign neoplasm of rectum: Secondary | ICD-10-CM | POA: Diagnosis not present

## 2019-10-06 DIAGNOSIS — D122 Benign neoplasm of ascending colon: Secondary | ICD-10-CM | POA: Diagnosis not present

## 2019-10-06 DIAGNOSIS — Z86010 Personal history of colonic polyps: Secondary | ICD-10-CM

## 2019-10-06 DIAGNOSIS — D127 Benign neoplasm of rectosigmoid junction: Secondary | ICD-10-CM | POA: Diagnosis not present

## 2019-10-06 DIAGNOSIS — E7800 Pure hypercholesterolemia, unspecified: Secondary | ICD-10-CM | POA: Diagnosis not present

## 2019-10-06 DIAGNOSIS — D125 Benign neoplasm of sigmoid colon: Secondary | ICD-10-CM

## 2019-10-06 DIAGNOSIS — I10 Essential (primary) hypertension: Secondary | ICD-10-CM | POA: Diagnosis not present

## 2019-10-06 MED ORDER — SODIUM CHLORIDE 0.9 % IV SOLN: 500.0000 mL | INTRAVENOUS | Status: DC

## 2019-10-06 NOTE — Op Note (Signed)
[facility] Patient Name: Daniel Rios Procedure Date: 10/06/2019 9:27 AM MRN: [id number] Endoscopist: Ladene Artist , MD Age: 64 Referring MD:  Date of Birth: 10/02/1955 Gender: Male Account #: [id number] Procedure:                Colonoscopy Indications:              Surveillance: Personal history of adenomatous                            polyps on last colonoscopy 5 years ago Medicines:                Monitored Anesthesia Care Procedure:                Pre-Anesthesia Assessment:                           - Prior to the procedure, a History and Physical                            was performed, and patient medications and                            allergies were reviewed. The patient's tolerance of                            previous anesthesia was also reviewed. The risks                            and benefits of the procedure and the sedation                            options and risks were discussed with the patient.                            All questions were answered, and informed consent                            was obtained. Prior Anticoagulants: The patient has                            taken no previous anticoagulant or antiplatelet                            agents. ASA Grade Assessment: II - A patient with                            mild systemic disease. After reviewing the risks                            and benefits, the patient was deemed in                            satisfactory condition to undergo the procedure.  After obtaining informed consent, the colonoscope                            was passed under direct vision. Throughout the                            procedure, the patient's blood pressure, pulse, and                            oxygen saturations were monitored continuously. The                            Colonoscope was introduced through the anus and                            advanced to the the cecum,  identified by                            appendiceal orifice and ileocecal valve. The                            ileocecal valve, appendiceal orifice, and rectum                            were photographed. The quality of the bowel                            preparation was good. The colonoscopy was performed                            without difficulty. The patient tolerated the                            procedure well. Scope In: 9:39:04 AM Scope Out: 9:52:32 AM Scope Withdrawal Time: 0 hours 12 minutes 3 seconds  Total Procedure Duration: 0 hours 13 minutes 28 seconds  Findings:                 The perianal and digital rectal examinations were                            normal.                           Three sessile polyps were found in the rectum,                            sigmoid colon and ascending colon. The polyps were                            6 to 7 mm in size. These polyps were removed with a                            cold snare. Resection and retrieval were complete.  Internal hemorrhoids were found during                            retroflexion. The hemorrhoids were small and Grade                            I (internal hemorrhoids that do not prolapse).                           The exam was otherwise without abnormality on                            direct and retroflexion views. Complications:            No immediate complications. Estimated blood loss:                            None. Estimated Blood Loss:     Estimated blood loss: none. Impression:               - Three 6 to 7 mm polyps in the rectum, in the                            sigmoid colon and in the ascending colon, removed                            with a cold snare. Resected and retrieved.                           - Internal hemorrhoids.                           - The examination was otherwise normal on direct                            and retroflexion  views. Recommendation:           - Repeat colonoscopy after studies are complete for                            surveillance based on pathology results.                           - Patient has a contact number available for                            emergencies. The signs and symptoms of potential                            delayed complications were discussed with the                            patient. Return to normal activities tomorrow.                            Written discharge instructions were  provided to the                            patient.                           - Resume previous diet.                           - Continue present medications.                           - Await pathology results. Ladene Artist, MD 10/06/2019 9:54:50 AM This report has been signed electronically.

## 2019-10-06 NOTE — Progress Notes (Signed)
Report given to PACU, vss 

## 2019-10-06 NOTE — Progress Notes (Signed)
Temp JB V/a CW  I have reviewed the patient's medical history in detail and updated the computerized patient record.

## 2019-10-06 NOTE — Patient Instructions (Signed)
3 polyps removed and sent to pathology.  Internal hemorrhoids.   Await pathology for final recommendations.  Handouts on findings given to patient.    YOU HAD AN ENDOSCOPIC PROCEDURE TODAY AT [facility]:   Refer to the procedure report that was given to you for any specific questions about what was found during the examination.  If the procedure report does not answer your questions, please call your gastroenterologist to clarify.  If you requested that your care partner not be given the details of your procedure findings, then the procedure report has been included in a sealed envelope for you to review at your convenience later.  YOU SHOULD EXPECT: Some feelings of bloating in the abdomen. Passage of more gas than usual.  Walking can help get rid of the air that was put into your GI tract during the procedure and reduce the bloating. If you had a lower endoscopy (such as a colonoscopy or flexible sigmoidoscopy) you may notice spotting of blood in your stool or on the toilet paper. If you underwent a bowel prep for your procedure, you may not have a normal bowel movement for a few days.  Please Note:  You might notice some irritation and congestion in your nose or some drainage.  This is from the oxygen used during your procedure.  There is no need for concern and it should clear up in a day or so.  SYMPTOMS TO REPORT IMMEDIATELY:   Following lower endoscopy (colonoscopy or flexible sigmoidoscopy):  Excessive amounts of blood in the stool  Significant tenderness or worsening of abdominal pains  Swelling of the abdomen that is new, acute  Fever of 100F or higher   For urgent or emergent issues, a gastroenterologist can be reached at any hour by calling [phone]. Do not use MyChart messaging for urgent concerns.    DIET:  We do recommend a small meal at first, but then you may proceed to your regular diet.  Drink plenty of fluids but you should avoid alcoholic beverages  for 24 hours.  ACTIVITY:  You should plan to take it easy for the rest of today and you should NOT DRIVE or use heavy machinery until tomorrow (because of the sedation medicines used during the test).    FOLLOW UP: Our staff will call the number listed on your records 48-72 hours following your procedure to check on you and address any questions or concerns that you may have regarding the information given to you following your procedure. If we do not reach you, we will leave a message.  We will attempt to reach you two times.  During this call, we will ask if you have developed any symptoms of COVID 19. If you develop any symptoms (ie: fever, flu-like symptoms, shortness of breath, cough etc.) before then, please call [phone].  If you test positive for Covid 19 in the 2 weeks post procedure, please call and report this information to [place].    If any biopsies were taken you will be contacted by phone or by letter within the next 1-3 weeks.  Please call us at [phone] if you have not heard about the biopsies in 3 weeks.    SIGNATURES/CONFIDENTIALITY: You and/or your care partner have signed paperwork which will be entered into your electronic medical record.  These signatures attest to the fact that that the information above on your After Visit Summary has been reviewed and is understood.  Full responsibility of the confidentiality of  this discharge information lies with you and/or your care-partner.

## 2019-10-06 NOTE — Progress Notes (Signed)
Called to room to assist during endoscopic procedure.  Patient ID and intended procedure confirmed with present staff. Received instructions for my participation in the procedure from the performing physician.  

## 2019-10-07 ENCOUNTER — Other Ambulatory Visit: Payer: Self-pay | Admitting: Family Medicine

## 2019-10-08 ENCOUNTER — Telehealth: Payer: Self-pay | Admitting: *Deleted

## 2019-10-08 NOTE — Telephone Encounter (Signed)
  Follow up Call-  Call back number 10/06/2019  Post procedure Call Back phone  # [phone]  Permission to leave phone message Yes  Some recent data might be hidden     No answer at # given.  LM on VM.

## 2019-10-19 ENCOUNTER — Encounter: Payer: Self-pay | Admitting: Gastroenterology

## 2019-11-02 DIAGNOSIS — Z85828 Personal history of other malignant neoplasm of skin: Secondary | ICD-10-CM | POA: Diagnosis not present

## 2019-11-02 DIAGNOSIS — L819 Disorder of pigmentation, unspecified: Secondary | ICD-10-CM | POA: Diagnosis not present

## 2019-11-02 DIAGNOSIS — L570 Actinic keratosis: Secondary | ICD-10-CM | POA: Diagnosis not present

## 2019-11-02 DIAGNOSIS — L820 Inflamed seborrheic keratosis: Secondary | ICD-10-CM | POA: Diagnosis not present

## 2019-11-02 DIAGNOSIS — D1801 Hemangioma of skin and subcutaneous tissue: Secondary | ICD-10-CM | POA: Diagnosis not present

## 2019-11-02 DIAGNOSIS — L905 Scar conditions and fibrosis of skin: Secondary | ICD-10-CM | POA: Diagnosis not present

## 2019-11-02 DIAGNOSIS — L814 Other melanin hyperpigmentation: Secondary | ICD-10-CM | POA: Diagnosis not present

## 2019-11-02 DIAGNOSIS — L821 Other seborrheic keratosis: Secondary | ICD-10-CM | POA: Diagnosis not present

## 2019-11-02 DIAGNOSIS — D229 Melanocytic nevi, unspecified: Secondary | ICD-10-CM | POA: Diagnosis not present

## 2019-12-11 ENCOUNTER — Ambulatory Visit (INDEPENDENT_AMBULATORY_CARE_PROVIDER_SITE_OTHER): Payer: PPO | Admitting: *Deleted

## 2019-12-11 DIAGNOSIS — Z23 Encounter for immunization: Secondary | ICD-10-CM

## 2020-01-06 ENCOUNTER — Other Ambulatory Visit: Payer: Self-pay | Admitting: Family Medicine

## 2020-01-06 DIAGNOSIS — E7800 Pure hypercholesterolemia, unspecified: Secondary | ICD-10-CM

## 2020-01-23 ENCOUNTER — Other Ambulatory Visit: Payer: Self-pay | Admitting: Family Medicine

## 2020-01-28 ENCOUNTER — Other Ambulatory Visit: Payer: Self-pay | Admitting: Family Medicine

## 2020-01-28 ENCOUNTER — Other Ambulatory Visit: Payer: Self-pay | Admitting: *Deleted

## 2020-01-28 MED ORDER — AMLODIPINE BESYLATE 5 MG PO TABS: ORAL_TABLET | ORAL | 3 refills | Status: DC

## 2020-01-28 MED ORDER — LOSARTAN POTASSIUM 100 MG PO TABS: ORAL_TABLET | ORAL | 3 refills | Status: DC

## 2020-01-28 MED ORDER — AMLODIPINE BESYLATE 5 MG PO TABS
ORAL_TABLET | ORAL | 3 refills | Status: DC
Start: 2020-01-28 — End: 2021-04-17

## 2020-03-21 DIAGNOSIS — L820 Inflamed seborrheic keratosis: Secondary | ICD-10-CM | POA: Diagnosis not present

## 2020-03-21 DIAGNOSIS — D485 Neoplasm of uncertain behavior of skin: Secondary | ICD-10-CM | POA: Diagnosis not present

## 2020-05-03 DIAGNOSIS — L570 Actinic keratosis: Secondary | ICD-10-CM | POA: Diagnosis not present

## 2020-05-03 DIAGNOSIS — L905 Scar conditions and fibrosis of skin: Secondary | ICD-10-CM | POA: Diagnosis not present

## 2020-05-03 DIAGNOSIS — Z85828 Personal history of other malignant neoplasm of skin: Secondary | ICD-10-CM | POA: Diagnosis not present

## 2020-05-03 DIAGNOSIS — L819 Disorder of pigmentation, unspecified: Secondary | ICD-10-CM | POA: Diagnosis not present

## 2020-05-13 ENCOUNTER — Encounter: Payer: Self-pay | Admitting: Emergency Medicine

## 2020-05-13 ENCOUNTER — Other Ambulatory Visit: Payer: Self-pay

## 2020-05-13 ENCOUNTER — Ambulatory Visit
Admission: EM | Admit: 2020-05-13 | Discharge: 2020-05-13 | Disposition: A | Discharge status: Home / Self Care | Payer: PPO | Attending: Family Medicine | Admitting: Family Medicine

## 2020-05-13 DIAGNOSIS — Z7689 Persons encountering health services in other specified circumstances: Secondary | ICD-10-CM | POA: Diagnosis not present

## 2020-05-13 DIAGNOSIS — R059 Cough, unspecified: Secondary | ICD-10-CM

## 2020-05-13 DIAGNOSIS — J069 Acute upper respiratory infection, unspecified: Secondary | ICD-10-CM | POA: Diagnosis not present

## 2020-05-13 DIAGNOSIS — J209 Acute bronchitis, unspecified: Secondary | ICD-10-CM

## 2020-05-13 DIAGNOSIS — R509 Fever, unspecified: Secondary | ICD-10-CM

## 2020-05-13 DIAGNOSIS — R0981 Nasal congestion: Secondary | ICD-10-CM

## 2020-05-13 DIAGNOSIS — R5383 Other fatigue: Secondary | ICD-10-CM

## 2020-05-13 DIAGNOSIS — R519 Headache, unspecified: Secondary | ICD-10-CM

## 2020-05-13 MED ORDER — AMOXICILLIN-POT CLAVULANATE 875-125 MG PO TABS: 1.0000 | ORAL_TABLET | Freq: Two times a day (BID) | ORAL | 0 refills | Status: AC

## 2020-05-13 MED ORDER — PREDNISONE 10 MG (21) PO TBPK: ORAL_TABLET | Freq: Every day | ORAL | 0 refills | Status: AC

## 2020-05-13 NOTE — Discharge Instructions (Signed)
I have sent in Augmentin for you to take twice a day for 7 days.  I have sent in a prednisone taper for you to take for 6 days. 6 tablets on day one, 5 tablets on day two, 4 tablets on day three, 3 tablets on day four, 2 tablets on day five, and 1 tablet on day six.  Your COVID and Flu tests are pending.  You should self quarantine until the test results are back.    Take Tylenol or ibuprofen as needed for fever or discomfort.  Rest and keep yourself hydrated.    Follow-up with your primary care provider if your symptoms are not improving.

## 2020-05-13 NOTE — ED Triage Notes (Signed)
Cough and sinus congestion x 2 months

## 2020-05-13 NOTE — ED Provider Notes (Signed)
[facility]   [id number] 05/13/20 Arrival Time: 0904  CC: COVID symptoms  SUBJECTIVE: History from: patient.  Daniel Rios is a 64 y.o. male who presents with abrupt onset of nasal congestion, PND, and persistent dry cough intermittently for the last 2 months.  Reports that he has been having sinus pain for the last 4 days. Denies sick exposure to COVID, flu or strep. Denies recent travel. Has negative history of Covid. Has not completed Covid vaccines. Has taken OTC cough and cold with no relief. There are no aggravating or alleviating factors. Denies previous symptoms in the past. Denies fever, chills, fatigue, rhinorrhea, sore throat, SOB, wheezing, chest pain, nausea, changes in bowel or bladder habits.    ROS: As per HPI.  All other pertinent ROS negative.     Past Medical History:  Diagnosis Date  . Allergy   . Hyperlipidemia   . Hypertension   . Strabismus    Past Surgical History:  Procedure Laterality Date  . COLONOSCOPY  11-01-2003   normal  . COLONOSCOPY  06/21/2014   1-TA  . FOOT SURGERY Right 2019   Allergies  Allergen Reactions  . Celebrex [Celecoxib] Swelling and Rash   Current Facility-Administered Medications on File Prior to Encounter  Medication Dose Route Frequency Provider Last Rate Last Admin  . 0.9 %  sodium chloride infusion  500 mL Intravenous Continuous Ladene Artist, MD       Current Outpatient Medications on File Prior to Encounter  Medication Sig Dispense Refill  . amLODipine (NORVASC) 5 MG tablet TAKE 1 TABLET(5 MG) BY MOUTH DAILY 90 tablet 3  . aspirin 81 MG tablet Take 81 mg by mouth daily.    . fluticasone (FLONASE) 50 MCG/ACT nasal spray Place 2 sprays into both nostrils daily. 16 g 6  . levocetirizine (XYZAL) 5 MG tablet Take 1 tablet (5 mg total) by mouth every evening. 30 tablet 5  . losartan (COZAAR) 100 MG tablet TAKE 1 TABLET(100 MG) BY MOUTH DAILY 90 tablet 3  . Multiple Vitamin (MULTIVITAMIN) tablet Take 1 tablet  by mouth daily. One a day 50+    . rosuvastatin (CRESTOR) 20 MG tablet TAKE 1 TABLET(20 MG) BY MOUTH DAILY 90 tablet 0  . tamsulosin (FLOMAX) 0.4 MG CAPS capsule TAKE 1 CAPSULE BY MOUTH EVERY NIGHT AT BEDTIME 90 capsule 3   Social History   Socioeconomic History  . Marital status: Unknown    Spouse name: Not on file  . Number of children: Not on file  . Years of education: Not on file  . Highest education level: Not on file  Occupational History  . Not on file  Tobacco Use  . Smoking status: Former Smoker    Years: 2.00    Types: Cigarettes    Quit date: 09/22/1982    Years since quitting: 37.6  . Smokeless tobacco: Never Used  Vaping Use  . Vaping Use: Never used  Substance and Sexual Activity  . Alcohol use: No  . Drug use: No  . Sexual activity: Not on file    Comment: married, quit smoking in 1982.    Other Topics Concern  . Not on file  Social History Narrative  . Not on file   Social Determinants of Health   Financial Resource Strain: Not on file  Food Insecurity: Not on file  Transportation Needs: Not on file  Physical Activity: Not on file  Stress: Not on file  Social Connections: Not on file  Intimate Partner  Violence: Not on file   Family History  Problem Relation Age of Onset  . Arthritis Mother   . Cancer Father        had metas.all over when found-? starting point   . COPD Brother   . Colon cancer Maternal Aunt   . Colon polyps Neg Hx   . Esophageal cancer Neg Hx   . Rectal cancer Neg Hx   . Stomach cancer Neg Hx     OBJECTIVE:  Vitals:   05/13/20 0945  BP: (!) 168/105  Pulse: 92  Resp: 16  Temp: 98.5 F (36.9 C)  TempSrc: Oral  SpO2: 95%     General appearance: alert; appears fatigued, but nontoxic; speaking in full sentences and tolerating own secretions HEENT: NCAT; Ears: EACs clear, TMs pearly gray; Eyes: PERRL.  EOM grossly intact. Sinuses: frontal and maxillary sinuses TTP; Nose: nares patent without rhinorrhea, Throat: oropharynx  mildly erythematous, cobblestoning present, tonsils non erythematous or enlarged, uvula midline  Neck: supple with LAD Lungs: unlabored respirations, symmetrical air entry; cough: absent; no respiratory distress; CTAB Heart: regular rate and rhythm.  Radial pulses 2+ symmetrical bilaterally Skin: warm and dry Psychological: alert and cooperative; normal mood and affect  LABS:  No results found for this or any previous visit (from the past 24 hour(s)).   ASSESSMENT & PLAN:  1. Upper respiratory tract infection, unspecified type   2. Cough   3. Fever, unspecified fever cause   4. Acute bronchitis, unspecified organism   5. Nasal congestion   6. Nonintractable headache, unspecified chronicity pattern, unspecified headache type   7. Other fatigue     Meds ordered this encounter  Medications  . amoxicillin-clavulanate (AUGMENTIN) 875-125 MG tablet    Sig: Take 1 tablet by mouth 2 (two) times daily for 7 days.    Dispense:  14 tablet    Refill:  0    Order Specific Question:   Supervising Provider    Answer:   Chase Picket [id number]  . predniSONE (STERAPRED UNI-PAK 21 TAB) 10 MG (21) TBPK tablet    Sig: Take by mouth daily for 6 days. Take 6 tablets on day 1, 5 tablets on day 2, 4 tablets on day 3, 3 tablets on day 4, 2 tablets on day 5, 1 tablet on day 6    Dispense:  21 tablet    Refill:  0    Order Specific Question:   Supervising Provider    Answer:   Chase Picket [id number]    Prescribe steroid taper Prescribed Augmentin twice daily x7 days We will treat for acute sinusitis and bronchitis COVID and flu testing ordered.  It will take between 1-2 days for test results.  Someone will contact you regarding abnormal results.    Patient should remain in quarantine until they have received Covid results.  If negative you may resume normal activities (go back to work/school) while practicing hand hygiene, social distance, and mask wearing.  If positive, patient should  remain in quarantine for 10 days from symptom onset AND greater than 72 hours after symptoms resolution (absence of fever without the use of fever-reducing medication and improvement in respiratory symptoms), whichever is longer Get plenty of rest and push fluids Use OTC zyrtec for nasal congestion, runny nose, and/or sore throat Use OTC flonase for nasal congestion and runny nose Use medications daily for symptom relief Use OTC medications like ibuprofen or tylenol as needed fever or pain Call or go to the ED if  you have any new or worsening symptoms such as fever, worsening cough, shortness of breath, chest tightness, chest pain, turning blue, changes in mental status.  Reviewed expectations re: course of current medical issues. Questions answered. Outlined signs and symptoms indicating need for more acute intervention. Patient verbalized understanding. After Visit Summary given.         Faustino Congress, NP 05/16/20 [phone]

## 2020-05-16 ENCOUNTER — Telehealth: Payer: Self-pay

## 2020-05-16 ENCOUNTER — Other Ambulatory Visit: Payer: Self-pay

## 2020-05-16 NOTE — Telephone Encounter (Signed)
Yes, refer to infusion clinic.  Just explain they may decline if he is not considered high risk.

## 2020-05-16 NOTE — Telephone Encounter (Signed)
Done, inform pt that it make take up to 48 hours based on response from the infusion clinic

## 2020-05-16 NOTE — Telephone Encounter (Signed)
Patient went to UC, tested positive for Covid 05-14-20 was given abx and steroid. Daniel Rios is asking if he could still be referred to Infusion Clinic being he's on the antibiotics.

## 2020-05-20 LAB — COVID-19, FLU A+B NAA
Influenza A, NAA: NOT DETECTED
Influenza B, NAA: NOT DETECTED
SARS-CoV-2, NAA: DETECTED — AB

## 2020-05-22 ENCOUNTER — Encounter (HOSPITAL_COMMUNITY): Payer: Self-pay | Admitting: Emergency Medicine

## 2020-05-22 ENCOUNTER — Other Ambulatory Visit: Payer: Self-pay

## 2020-05-22 ENCOUNTER — Emergency Department (HOSPITAL_COMMUNITY): Payer: PPO

## 2020-05-22 ENCOUNTER — Ambulatory Visit
Admission: EM | Admit: 2020-05-22 | Discharge: 2020-05-22 | Disposition: A | Discharge status: Home / Self Care | Payer: PPO

## 2020-05-22 ENCOUNTER — Inpatient Hospital Stay (HOSPITAL_COMMUNITY)
Admission: EM | Admit: 2020-05-22 | Discharge: 2020-05-26 | DRG: 177 | Disposition: A | Discharge status: Home / Self Care | Payer: PPO | Attending: Internal Medicine | Admitting: Internal Medicine

## 2020-05-22 DIAGNOSIS — I10 Essential (primary) hypertension: Secondary | ICD-10-CM | POA: Diagnosis not present

## 2020-05-22 DIAGNOSIS — U071 COVID-19: Principal | ICD-10-CM | POA: Diagnosis present

## 2020-05-22 DIAGNOSIS — E876 Hypokalemia: Secondary | ICD-10-CM | POA: Diagnosis not present

## 2020-05-22 DIAGNOSIS — J189 Pneumonia, unspecified organism: Secondary | ICD-10-CM | POA: Diagnosis not present

## 2020-05-22 DIAGNOSIS — R0902 Hypoxemia: Secondary | ICD-10-CM

## 2020-05-22 DIAGNOSIS — Z7982 Long term (current) use of aspirin: Secondary | ICD-10-CM | POA: Diagnosis not present

## 2020-05-22 DIAGNOSIS — Z87891 Personal history of nicotine dependence: Secondary | ICD-10-CM | POA: Diagnosis not present

## 2020-05-22 DIAGNOSIS — J309 Allergic rhinitis, unspecified: Secondary | ICD-10-CM | POA: Diagnosis present

## 2020-05-22 DIAGNOSIS — J069 Acute upper respiratory infection, unspecified: Secondary | ICD-10-CM | POA: Diagnosis not present

## 2020-05-22 DIAGNOSIS — E871 Hypo-osmolality and hyponatremia: Secondary | ICD-10-CM | POA: Diagnosis not present

## 2020-05-22 DIAGNOSIS — J9601 Acute respiratory failure with hypoxia: Secondary | ICD-10-CM | POA: Diagnosis present

## 2020-05-22 DIAGNOSIS — Z800 Family history of malignant neoplasm of digestive organs: Secondary | ICD-10-CM | POA: Diagnosis not present

## 2020-05-22 DIAGNOSIS — Z825 Family history of asthma and other chronic lower respiratory diseases: Secondary | ICD-10-CM

## 2020-05-22 DIAGNOSIS — N400 Enlarged prostate without lower urinary tract symptoms: Secondary | ICD-10-CM | POA: Diagnosis not present

## 2020-05-22 DIAGNOSIS — R0602 Shortness of breath: Secondary | ICD-10-CM | POA: Diagnosis not present

## 2020-05-22 DIAGNOSIS — Z79899 Other long term (current) drug therapy: Secondary | ICD-10-CM | POA: Diagnosis not present

## 2020-05-22 DIAGNOSIS — Z888 Allergy status to other drugs, medicaments and biological substances status: Secondary | ICD-10-CM | POA: Diagnosis not present

## 2020-05-22 DIAGNOSIS — Z8261 Family history of arthritis: Secondary | ICD-10-CM

## 2020-05-22 DIAGNOSIS — E785 Hyperlipidemia, unspecified: Secondary | ICD-10-CM | POA: Diagnosis not present

## 2020-05-22 DIAGNOSIS — N179 Acute kidney failure, unspecified: Secondary | ICD-10-CM | POA: Diagnosis present

## 2020-05-22 DIAGNOSIS — I959 Hypotension, unspecified: Secondary | ICD-10-CM | POA: Diagnosis present

## 2020-05-22 DIAGNOSIS — R509 Fever, unspecified: Secondary | ICD-10-CM | POA: Diagnosis not present

## 2020-05-22 DIAGNOSIS — J1282 Pneumonia due to coronavirus disease 2019: Secondary | ICD-10-CM | POA: Diagnosis not present

## 2020-05-22 DIAGNOSIS — R059 Cough, unspecified: Secondary | ICD-10-CM | POA: Diagnosis not present

## 2020-05-22 DIAGNOSIS — R739 Hyperglycemia, unspecified: Secondary | ICD-10-CM | POA: Diagnosis present

## 2020-05-22 LAB — CBC WITH DIFFERENTIAL/PLATELET
Abs Immature Granulocytes: 0.07 10*3/uL (ref 0.00–0.07)
Basophils Absolute: 0 10*3/uL (ref 0.0–0.1)
Basophils Relative: 0 %
Eosinophils Absolute: 0 10*3/uL (ref 0.0–0.5)
Eosinophils Relative: 0 %
HCT: 46.2 % (ref 39.0–52.0)
Hemoglobin: 15.7 g/dL (ref 13.0–17.0)
Immature Granulocytes: 1 %
Lymphocytes Relative: 7 %
Lymphs Abs: 0.9 10*3/uL (ref 0.7–4.0)
MCH: 32.2 pg (ref 26.0–34.0)
MCHC: 34 g/dL (ref 30.0–36.0)
MCV: 94.9 fL (ref 80.0–100.0)
Monocytes Absolute: 1 10*3/uL (ref 0.1–1.0)
Monocytes Relative: 8 %
Neutro Abs: 10.8 10*3/uL — ABNORMAL HIGH (ref 1.7–7.7)
Neutrophils Relative %: 84 %
Platelets: 192 10*3/uL (ref 150–400)
RBC: 4.87 MIL/uL (ref 4.22–5.81)
RDW: 12.7 % (ref 11.5–15.5)
WBC: 12.8 10*3/uL — ABNORMAL HIGH (ref 4.0–10.5)
nRBC: 0 % (ref 0.0–0.2)

## 2020-05-22 LAB — PROTEIN / CREATININE RATIO, URINE
Creatinine, Urine: 87.05 mg/dL
Protein Creatinine Ratio: 0.18 mg/mg{Cre} — ABNORMAL HIGH (ref 0.00–0.15)
Total Protein, Urine: 16 mg/dL

## 2020-05-22 LAB — COMPREHENSIVE METABOLIC PANEL
ALT: 68 U/L — ABNORMAL HIGH (ref 0–44)
AST: 83 U/L — ABNORMAL HIGH (ref 15–41)
Albumin: 3.1 g/dL — ABNORMAL LOW (ref 3.5–5.0)
Alkaline Phosphatase: 49 U/L (ref 38–126)
Anion gap: 10 (ref 5–15)
BUN: 36 mg/dL — ABNORMAL HIGH (ref 8–23)
CO2: 21 mmol/L — ABNORMAL LOW (ref 22–32)
Calcium: 7.5 mg/dL — ABNORMAL LOW (ref 8.9–10.3)
Chloride: 96 mmol/L — ABNORMAL LOW (ref 98–111)
Creatinine, Ser: 2.49 mg/dL — ABNORMAL HIGH (ref 0.61–1.24)
GFR, Estimated: 28 mL/min — ABNORMAL LOW (ref >60)
Glucose, Bld: 103 mg/dL — ABNORMAL HIGH (ref 70–99)
Potassium: 4 mmol/L (ref 3.5–5.1)
Sodium: 127 mmol/L — ABNORMAL LOW (ref 135–145)
Total Bilirubin: 0.3 mg/dL (ref 0.3–1.2)
Total Protein: 6.5 g/dL (ref 6.5–8.1)

## 2020-05-22 LAB — URINALYSIS, ROUTINE W REFLEX MICROSCOPIC
Bilirubin Urine: NEGATIVE
Glucose, UA: NEGATIVE mg/dL
Hgb urine dipstick: NEGATIVE
Ketones, ur: 5 mg/dL — AB
Leukocytes,Ua: NEGATIVE
Nitrite: NEGATIVE
Protein, ur: NEGATIVE mg/dL
Specific Gravity, Urine: 1.008 (ref 1.005–1.030)
pH: 5 (ref 5.0–8.0)

## 2020-05-22 LAB — LACTIC ACID, PLASMA: Lactic Acid, Venous: 1.3 mmol/L (ref 0.5–1.9)

## 2020-05-22 LAB — D-DIMER, QUANTITATIVE: D-Dimer, Quant: 0.7 ug/mL-FEU — ABNORMAL HIGH (ref 0.00–0.50)

## 2020-05-22 LAB — TRIGLYCERIDES: Triglycerides: 116 mg/dL (ref <150)

## 2020-05-22 LAB — FIBRINOGEN: Fibrinogen: 624 mg/dL — ABNORMAL HIGH (ref 210–475)

## 2020-05-22 LAB — CBG MONITORING, ED
Glucose-Capillary: 98 mg/dL (ref 70–99)
Glucose-Capillary: 112 mg/dL — ABNORMAL HIGH (ref 70–99)

## 2020-05-22 LAB — SODIUM, URINE, RANDOM: Sodium, Ur: 17 mmol/L

## 2020-05-22 LAB — PROCALCITONIN: Procalcitonin: 0.2 ng/mL

## 2020-05-22 LAB — LACTATE DEHYDROGENASE: LDH: 365 U/L — ABNORMAL HIGH (ref 98–192)

## 2020-05-22 LAB — C-REACTIVE PROTEIN: CRP: 6.9 mg/dL — ABNORMAL HIGH (ref <1.0)

## 2020-05-22 LAB — FERRITIN: Ferritin: 1009 ng/mL — ABNORMAL HIGH (ref 24–336)

## 2020-05-22 MED ORDER — STERILE WATER FOR INJECTION IV SOLN
INTRAVENOUS | Status: AC
  Filled 2020-05-22: qty 850

## 2020-05-22 MED ORDER — ACETAMINOPHEN 325 MG PO TABS
650.0000 mg | ORAL_TABLET | Freq: Four times a day (QID) | ORAL | Status: DC | PRN
  Administered 2020-05-22: 650 mg via ORAL
  Filled 2020-05-22: qty 2

## 2020-05-22 MED ORDER — ONDANSETRON HCL 4 MG/2ML IJ SOLN: 4.0000 mg | Freq: Four times a day (QID) | INTRAMUSCULAR | Status: DC | PRN

## 2020-05-22 MED ORDER — BENZONATATE 100 MG PO CAPS
100.0000 mg | ORAL_CAPSULE | Freq: Three times a day (TID) | ORAL | Status: DC
  Administered 2020-05-22 – 2020-05-26 (×12): 100 mg via ORAL
  Filled 2020-05-22 (×12): qty 1

## 2020-05-22 MED ORDER — HYDROCOD POLST-CPM POLST ER 10-8 MG/5ML PO SUER
5.0000 mL | Freq: Two times a day (BID) | ORAL | Status: DC | PRN
  Administered 2020-05-22 – 2020-05-25 (×4): 5 mL via ORAL
  Filled 2020-05-22 (×4): qty 5

## 2020-05-22 MED ORDER — TAMSULOSIN HCL 0.4 MG PO CAPS
0.4000 mg | ORAL_CAPSULE | Freq: Every day | ORAL | Status: DC
  Administered 2020-05-22 – 2020-05-25 (×4): 0.4 mg via ORAL
  Filled 2020-05-22 (×4): qty 1

## 2020-05-22 MED ORDER — FLUTICASONE PROPIONATE 50 MCG/ACT NA SUSP
2.0000 | Freq: Every day | NASAL | Status: DC
  Administered 2020-05-22 – 2020-05-26 (×5): 2 via NASAL
  Filled 2020-05-22 (×2): qty 16

## 2020-05-22 MED ORDER — INSULIN ASPART 100 UNIT/ML ~~LOC~~ SOLN: 0.0000 [IU] | Freq: Every day | SUBCUTANEOUS | Status: DC

## 2020-05-22 MED ORDER — ENOXAPARIN SODIUM 40 MG/0.4ML ~~LOC~~ SOLN
40.0000 mg | SUBCUTANEOUS | Status: DC
  Administered 2020-05-22 – 2020-05-25 (×4): 40 mg via SUBCUTANEOUS
  Filled 2020-05-22 (×5): qty 0.4

## 2020-05-22 MED ORDER — LEVOCETIRIZINE DIHYDROCHLORIDE 5 MG PO TABS
5.0000 mg | ORAL_TABLET | Freq: Every evening | ORAL | Status: DC
  Filled 2020-05-22 (×3): qty 1

## 2020-05-22 MED ORDER — ONDANSETRON HCL 4 MG PO TABS: 4.0000 mg | ORAL_TABLET | Freq: Four times a day (QID) | ORAL | Status: DC | PRN

## 2020-05-22 MED ORDER — SODIUM CHLORIDE 0.9 % IV SOLN
200.0000 mg | Freq: Once | INTRAVENOUS | Status: AC
  Administered 2020-05-22: 200 mg via INTRAVENOUS
  Filled 2020-05-22: qty 40

## 2020-05-22 MED ORDER — METHYLPREDNISOLONE SODIUM SUCC 125 MG IJ SOLR
80.0000 mg | Freq: Two times a day (BID) | INTRAMUSCULAR | Status: DC
  Administered 2020-05-22 – 2020-05-26 (×8): 80 mg via INTRAVENOUS
  Filled 2020-05-22 (×8): qty 2

## 2020-05-22 MED ORDER — ALBUTEROL SULFATE HFA 108 (90 BASE) MCG/ACT IN AERS
2.0000 | INHALATION_SPRAY | Freq: Once | RESPIRATORY_TRACT | Status: AC
  Administered 2020-05-22: 2 via RESPIRATORY_TRACT
  Filled 2020-05-22: qty 6.7

## 2020-05-22 MED ORDER — SODIUM BICARBONATE 8.4 % IV SOLN
INTRAVENOUS | Status: AC
  Filled 2020-05-22: qty 150

## 2020-05-22 MED ORDER — SODIUM CHLORIDE 0.9% FLUSH
3.0000 mL | Freq: Two times a day (BID) | INTRAVENOUS | Status: DC
  Administered 2020-05-22 – 2020-05-26 (×7): 3 mL via INTRAVENOUS

## 2020-05-22 MED ORDER — SODIUM CHLORIDE 0.9 % IV SOLN
100.0000 mg | Freq: Every day | INTRAVENOUS | Status: AC
  Administered 2020-05-23 – 2020-05-26 (×4): 100 mg via INTRAVENOUS
  Filled 2020-05-22 (×5): qty 20

## 2020-05-22 MED ORDER — INSULIN ASPART 100 UNIT/ML ~~LOC~~ SOLN
0.0000 [IU] | Freq: Three times a day (TID) | SUBCUTANEOUS | Status: DC
  Administered 2020-05-23: 3 [IU] via SUBCUTANEOUS
  Administered 2020-05-23: 2 [IU] via SUBCUTANEOUS
  Administered 2020-05-23 – 2020-05-25 (×6): 3 [IU] via SUBCUTANEOUS
  Administered 2020-05-25: 2 [IU] via SUBCUTANEOUS
  Administered 2020-05-26: 3 [IU] via SUBCUTANEOUS
  Administered 2020-05-26: 2 [IU] via SUBCUTANEOUS

## 2020-05-22 MED ORDER — SODIUM CHLORIDE 0.9 % IV BOLUS
500.0000 mL | Freq: Once | INTRAVENOUS | Status: AC
  Administered 2020-05-22: 500 mL via INTRAVENOUS

## 2020-05-22 MED ORDER — ROSUVASTATIN CALCIUM 20 MG PO TABS
20.0000 mg | ORAL_TABLET | Freq: Every day | ORAL | Status: DC
  Administered 2020-05-23 – 2020-05-26 (×4): 20 mg via ORAL
  Filled 2020-05-22 (×6): qty 1

## 2020-05-22 NOTE — ED Triage Notes (Signed)
Patient brought in via EMS from Urgent Care. Alert and oriented. Patient sent due to low O2 sats. Patient tested positive on 12/24 for Covid ant Urgent Care. Patient's becoming more short of breath and fatigued since. Patient states cough, fever, fatigue, nausea, vomiting, and diarrhea. Per patient highest temp 103. Patient taking Advil at 9am this morning. Patient's O2 sat 83% on upon arrival to Urgent Care per paramedic. Patients's O2 sat now 91% on room air. 2 liters of O2 applied via [place]-O2 sat now 98%.

## 2020-05-22 NOTE — ED Notes (Signed)
Respiratory at bedside, made aware that patient 02 sats are between 88-91% on 4LNC in both supine and prone positions.

## 2020-05-22 NOTE — ED Notes (Signed)
2058 Dr. Alfonzo Feller made aware that respiratory is at bedside, pt 02 sats between 88-91% on 4LNC despite repositioning from supine to prone positioning.   2121  Dr. Rennis Petty made aware that pt has been weaned down from 13L humidified high flow nasal cannula to 6L humidified high flow nasal cannula, maintaining 02 sats between 93-96%.

## 2020-05-22 NOTE — ED Notes (Signed)
Pt placed on 2L [place] per provider.

## 2020-05-22 NOTE — ED Provider Notes (Signed)
EMS is called for patient based on unstable vital signs.  Hypoxia.  Hypotension.  Known Covid positive.  BP (!) 89/58 (BP Location: Right Arm)   Pulse 92   Temp 99 F (37.2 C) (Oral)   Resp (!) 22   SpO2 (!) 87%     Eustace Moore, MD 05/22/20 1118

## 2020-05-22 NOTE — ED Notes (Signed)
Patient is being discharged from the Urgent Care and sent to the Emergency Department via ems  . Per Dr Delton See  patient is in need of higher level of care due to hypotension, hypoxia . Patient is aware and verbalizes understanding of plan of care.  Vitals:   05/22/20 1057  BP: (!) 89/58  Pulse: 92  Resp: (!) 22  Temp: 99 F (37.2 C)  SpO2: (!) 87%

## 2020-05-22 NOTE — H&P (Signed)
History and Physical   Daniel Gottronimothy R Fahrner UJW:[id number] DOB: 01/18/1956 DOA: 05/22/2020  Referring MD/NP/PA: Langston MaskerKaren Sofia, PA, EDP PCP: Donita BrooksPickard, Warren T, MD  Patient coming from: Home by way of urgent care  Chief Complaint: Hypoxia at urgent care  HPI: Daniel Rios is a 65 y.o. male with a history of HTN, HLD, covid-19 diagnosis 05/13/2020 who presented to urgent care earlier today for progressive, worsening symptoms and was found to be hypoxic and hypotensive, sent by EMS to [facility]. He reported increasing fatigue, generalized weakness, severe nausea, poor appetite and poor per oral intake as well as increasingly severe nonproductive cough and fevers regularly over the past few days. His whole family has covid but most have improved. He has taken BP medications including today, and was taking an antibiotic and steroid due to suspected sinus infection PTA.   ED Course: Mildly hypoxic at rest, worse with coughing spells, appearing dehydrated with AKI on labs. CXR was reassuring. Hospitalists called for admission due to hypoxia and AKI.  Review of Systems: +abdominal soreness when coughing only, and some diarrhea. Denies changes in vision or hearing, headache, chest pain, palpitations, vomiting, blood in stool, change in bladder habits, myalgias, arthralgias, and rash, and per HPI. All others reviewed and are negative.   Past Medical History:  Diagnosis Date  . Allergy   . Hyperlipidemia   . Hypertension   . Strabismus    Past Surgical History:  Procedure Laterality Date  . COLONOSCOPY  11-01-2003   normal  . COLONOSCOPY  06/21/2014   1-TA  . FOOT SURGERY Right 2019   - Remote smoker, stopped 45 years ago. No binge drinking, but does drink alcohol regularly. No illicit drugs.  Allergies  Allergen Reactions  . Celebrex [Celecoxib] Swelling and Rash   Family History  Problem Relation Age of Onset  . Arthritis Mother   . Cancer Father        had metas.all over when found-? starting  point   . COPD Brother   . Colon cancer Maternal Aunt   . Colon polyps Neg Hx   . Esophageal cancer Neg Hx   . Rectal cancer Neg Hx   . Stomach cancer Neg Hx    - Family history otherwise reviewed and not pertinent.  Prior to Admission medications   Medication Sig Start Date End Date Taking? Authorizing Provider  amLODipine (NORVASC) 5 MG tablet TAKE 1 TABLET(5 MG) BY MOUTH DAILY Patient taking differently: Take 5 mg by mouth daily. 01/28/20  Yes Donita BrooksPickard, Warren T, MD  aspirin 81 MG tablet Take 81 mg by mouth daily.   Yes [provider]  fluticasone (FLONASE) 50 MCG/ACT nasal spray Place 2 sprays into both nostrils daily. 08/17/19  Yes Donita BrooksPickard, Warren T, MD  levocetirizine (XYZAL) 5 MG tablet Take 1 tablet (5 mg total) by mouth every evening. 08/17/19  Yes Donita BrooksPickard, Warren T, MD  losartan (COZAAR) 100 MG tablet TAKE 1 TABLET(100 MG) BY MOUTH DAILY 01/28/20  Yes Donita BrooksPickard, Warren T, MD  Multiple Vitamin (MULTIVITAMIN) tablet Take 1 tablet by mouth daily. One a day 50+   Yes [provider]  rosuvastatin (CRESTOR) 20 MG tablet TAKE 1 TABLET(20 MG) BY MOUTH DAILY Patient taking differently: Take 20 mg by mouth daily. 01/06/20  Yes Donita BrooksPickard, Warren T, MD  tamsulosin (FLOMAX) 0.4 MG CAPS capsule TAKE 1 CAPSULE BY MOUTH EVERY NIGHT AT BEDTIME Patient taking differently: Take 0.4 mg by mouth at bedtime. 09/09/19  Yes Donita BrooksPickard, Warren T, MD  Physical Exam: Vitals:   05/22/20 1400 05/22/20 1430 05/22/20 1445 05/22/20 1530  BP: 99/72 127/70  129/76  Pulse: 76 82 88 86  Resp: (!) 21 (!) 26 18 19   SpO2: 90% 90% 90% 90%  Weight:      Height:       Constitutional: 65 y.o. male in no distress, calm demeanor Eyes: Lids and conjunctivae normal, PERRL ENMT: Mucous membranes are dry. Posterior pharynx clear of any exudate or lesions. Fair dentition.  Neck: normal, supple, no masses, no thyromegaly Respiratory: Non-labored but tachypneic at rest, breathing 2L O2, SpO2 88-92% without  accessory muscle use. Worse O2 when coughing spells. Crackles definite bilaterally in mid and lower lung zones.  Cardiovascular: Regular rate and rhythm, no murmurs, rubs, or gallops. No carotid bruits. No JVD. No LE edema. Palpable distal pulses. Abdomen: Normoactive bowel sounds. No tenderness, non-distended, and no masses palpated. No hepatosplenomegaly. GU: No indwelling catheter Musculoskeletal: No clubbing / cyanosis. No joint deformity upper and lower extremities. Good ROM, no contractures. Normal muscle tone.  Skin: Warm, dry. No rashes, wounds, or ulcers on visualized skin. Neurologic: CN II-XII grossly intact. Speech normal. No focal deficits in motor strength or sensation in all extremities.  Psychiatric: Alert and oriented x3. Normal judgment and insight.   Labs on Admission: I have personally reviewed following labs and imaging studies  CBC: Recent Labs  Lab 05/22/20 1318  WBC 12.8*  NEUTROABS 10.8*  HGB 15.7  HCT 46.2  MCV 94.9  PLT [id number]   Basic Metabolic Panel: Recent Labs  Lab 05/22/20 1318  NA 127*  K 4.0  CL 96*  CO2 21*  GLUCOSE 103*  BUN 36*  CREATININE 2.49*  CALCIUM 7.5*   GFR: Estimated Creatinine Clearance: 32 mL/min (A) (by C-G formula based on SCr of 2.49 mg/dL (H)). Liver Function Tests: Recent Labs  Lab 05/22/20 1318  AST 83*  ALT 68*  ALKPHOS 49  BILITOT 0.3  PROT 6.5  ALBUMIN 3.1*   Anemia Panel: Recent Labs    05/22/20 1318  FERRITIN 1,009*   Urine analysis:    Component Value Date/Time   COLORURINE YELLOW 04/16/2018 [place] 04/16/2018 1015   LABSPEC 1.010 04/16/2018 1015   PHURINE 6.0 04/16/2018 1015   GLUCOSEU NEGATIVE 04/16/2018 1015   HGBUR TRACE (A) 04/16/2018 1015   KETONESUR NEGATIVE 04/16/2018 1015   PROTEINUR NEGATIVE 04/16/2018 1015   NITRITE NEGATIVE 04/16/2018 1015   LEUKOCYTESUR NEGATIVE 04/16/2018 1015    Recent Results (from the past 240 hour(s))  Covid-19, Flu A+B ([facility])      Status: Abnormal   Collection Time: 05/13/20 10:20 AM   Specimen: Nasopharyngeal   Naso  Result Value Ref Range Status   SARS-CoV-2, NAA Detected (A) Not Detected Final    Comment: Patients who have a positive COVID-19 test result may now have treatment options. Treatment options are available for patients with mild to moderate symptoms and for hospitalized patients. Visit our website at [email or web address] for resources and information. This nucleic acid amplification test was developed and its performance characteristics determined by [employer]. Nucleic acid amplification tests include RT-PCR and TMA. This test has not been FDA cleared or approved. This test has been authorized by FDA under an Emergency Use Authorization (EUA). This test is only authorized for the duration of time the declaration that circumstances exist justifying the authorization of the emergency use of in vitro diagnostic tests for detection of SARS-CoV-2 virus and/or diagnosis of COVID-19 infection under  section 564(b)(1) of the Act, 21 U.S.C. GF:7541899) (1), unless the authorization is terminated or revoked sooner. When diagnostic testing is negativ e, the possibility of a false negative result should be considered in the context of a patient's recent exposures and the presence of clinical signs and symptoms consistent with COVID-19. An individual without symptoms of COVID-19 and who is not shedding SARS-CoV-2 virus would expect to have a negative (not detected) result in this assay.    Influenza A, NAA Not Detected Not Detected Final   Influenza B, NAA Not Detected Not Detected Final  Blood Culture (routine x 2)     Status: None (Preliminary result)   Collection Time: 05/22/20  2:07 PM   Specimen: Left Antecubital; Blood  Result Value Ref Range Status   Specimen Description   Final    LEFT ANTECUBITAL BOTTLES DRAWN AEROBIC AND ANAEROBIC   Special Requests   Final    Blood Culture  adequate volume Performed at [facility], [address] [place], [place] [address]    Culture PENDING  Incomplete   Report Status PENDING  Incomplete  Blood Culture (routine x 2)     Status: None (Preliminary result)   Collection Time: 05/22/20  2:09 PM   Specimen: Right Antecubital; Blood  Result Value Ref Range Status   Specimen Description   Final    RIGHT ANTECUBITAL BOTTLES DRAWN AEROBIC AND ANAEROBIC   Special Requests   Final    Blood Culture adequate volume Performed at [facility], [address] [place], [place] [address]    Culture PENDING  Incomplete   Report Status PENDING  Incomplete     Radiological Exams on Admission: DG Chest Port 1 View  Result Date: 05/22/2020 CLINICAL DATA:  COVID positive.  Shortness of breath and cough. EXAM: PORTABLE CHEST 1 VIEW COMPARISON:  None. FINDINGS: The heart size and mediastinal contours are within normal limits. Both lungs are clear. The visualized skeletal structures are unremarkable. IMPRESSION: No active disease. Electronically Signed   By: Abelardo Diesel M.D.   On: 05/22/2020 13:34   EKG: Independently reviewed. Sinus rhythm without tachycardia. II, III, aVF have submillimeter elevation of ST segment, not diagnostic and no reciprocal changes.  Assessment/Plan Active Problems:   COVID-19 virus infection   Acute hypoxemic respiratory failure due to covid-19 pneumonia: SARS-CoV-2 PCR positive on 05/13/20, hypoxic as of 05/22/20. Note CXR on my personal review does have some hazy peripheral-predominant opacities bilaterally and patient certainly has exam evidence of pneumonia. Hypoxia is otherwise unexplained, though if renal function improves, infiltrates do not develop, and hypoxia does not improve and/or d-dimer rises, would have low threshold for CTA chest.  - Duration of illness is ~10 days at this time suggesting marginal benefit of remdesivir, though may still outweigh potential risks. This was discussed with the patient who opts  to proceed - Start steroids due to hypoxia. Note CXR on my personal review does have some hazy peripheral-predominant opacities bilaterally and patient certainly has exam evidence of pneumonia.  - No indication for immunomodulator at this time. Would need tocilizumab vs. renally dosed baricitinib. - Maximize antitussives.  - DC cardiac monitoring to facilitate movement, continue to encourage IS.  - Continue airborne, contact precautions - Monitor CMP and inflammatory markers - Enoxaparin prophylactic dose.  - Encouraged to get 1st dose of vaccine between 21 and 90 days from infection Dx.  - Chart biopsy reveals some hyperglycemia, and mention of prediabetes though pt unaware of any such diagnosis. Will cover with  SSI while starting steroids and check HbA1c.  - Will trend PCT x1. Leukocytosis is likely due to steroids. Will not start abx at this time, though would have low threshold if unilateral infiltrate develops.  AKI, NAGMA: Suspected to be prerenal in light of GI losses, poor per oral intake, and also likely direct covid effect. May be related to hypotension which was noted at urgent care but has since improved. Also may be some element of progressive chronic renal disease with HTN and reported prediabetes, though this is not clear.  - Give gentle bicarb gtt x1L over 10 hours, recheck in AM. No hx CHF or current infiltrates. - Avoid ARB for now - Check UA and lytes  LFT elevation: Most likely related to covid directly.  - Continue to monitor.   BPH:  - Continue tamsulosin  HTN: With reported hypotension earlier today and renal injury, will hold norvasc and losartan  Allergic rhinitis:  - Continue atihistamine and flonase.   HLD:  - Continue statin for now, monitor LFTs.   DVT prophylaxis: Lovenox  Code Status: Full  Family Communication: None at bedside, did not request a call. Wife was sent home from urgent care with Dx covid pneumonia. Disposition Plan: Anticipate return home  after admission Consults called: None  Admission status: Inpatient    Tyrone Nine, MD [facility] [email or web address] 05/22/2020, 4:24 PM

## 2020-05-22 NOTE — ED Triage Notes (Signed)
Pt covid s/s started x 10 days. Pt became shortness of breath for past few days.  Pt resp labored when walking from waiting room to room.  Denies vomiting but reports unable to eat or drink d/t nausea.

## 2020-05-22 NOTE — ED Provider Notes (Signed)
[facility] EMERGENCY DEPARTMENT Provider Note   CSN: [id number] Arrival date & time: 05/22/20  1201     History Chief Complaint  Patient presents with  . Covid Positive    Daniel Rios is a 65 y.o. male.  Pt tested positive for covid on 12/24.  Pt sent here from Urgent care after going there with shortness of breath.  02 87%.  Pt complains of cough and shortness of breath.    The history is provided by the patient. No language interpreter was used.  Shortness of Breath Severity:  Moderate Onset quality:  Gradual Timing:  Constant Progression:  Worsening Chronicity:  New Relieved by:  Nothing Worsened by:  Nothing Ineffective treatments:  None tried Associated symptoms: cough   Associated symptoms: no vomiting        Past Medical History:  Diagnosis Date  . Allergy   . Hyperlipidemia   . Hypertension   . Strabismus     Patient Active Problem List   Diagnosis Date Noted  . Strabismus   . Hyperlipidemia   . Hypertension     Past Surgical History:  Procedure Laterality Date  . COLONOSCOPY  11-01-2003   normal  . COLONOSCOPY  06/21/2014   1-TA  . FOOT SURGERY Right 2019       Family History  Problem Relation Age of Onset  . Arthritis Mother   . Cancer Father        had metas.all over when found-? starting point   . COPD Brother   . Colon cancer Maternal Aunt   . Colon polyps Neg Hx   . Esophageal cancer Neg Hx   . Rectal cancer Neg Hx   . Stomach cancer Neg Hx     Social History   Tobacco Use  . Smoking status: Former Smoker    Years: 2.00    Types: Cigarettes    Quit date: 09/22/1982    Years since quitting: 37.6  . Smokeless tobacco: Never Used  Vaping Use  . Vaping Use: Never used  Substance Use Topics  . Alcohol use: Yes    Alcohol/week: 7.0 standard drinks    Types: 7 Cans of beer per week  . Drug use: No    Home Medications Prior to Admission medications   Medication Sig Start Date End Date Taking? Authorizing Provider   amLODipine (NORVASC) 5 MG tablet TAKE 1 TABLET(5 MG) BY MOUTH DAILY 01/28/20   Susy Frizzle, MD  aspirin 81 MG tablet Take 81 mg by mouth daily.    [provider]  fluticasone (FLONASE) 50 MCG/ACT nasal spray Place 2 sprays into both nostrils daily. 08/17/19   Susy Frizzle, MD  levocetirizine (XYZAL) 5 MG tablet Take 1 tablet (5 mg total) by mouth every evening. 08/17/19   Susy Frizzle, MD  losartan (COZAAR) 100 MG tablet TAKE 1 TABLET(100 MG) BY MOUTH DAILY 01/28/20   Susy Frizzle, MD  Multiple Vitamin (MULTIVITAMIN) tablet Take 1 tablet by mouth daily. One a day 50+    [provider]  rosuvastatin (CRESTOR) 20 MG tablet TAKE 1 TABLET(20 MG) BY MOUTH DAILY 01/06/20   Susy Frizzle, MD  tamsulosin (FLOMAX) 0.4 MG CAPS capsule TAKE 1 CAPSULE BY MOUTH EVERY NIGHT AT BEDTIME 09/09/19   Susy Frizzle, MD    Allergies    Celebrex [celecoxib]  Review of Systems   Review of Systems  Respiratory: Positive for cough and shortness of breath.   Gastrointestinal: Negative for vomiting.  All other systems reviewed and are negative.   Physical Exam Updated Vital Signs BP 127/70   Pulse 88   Resp 18   Ht 6' (1.829 m)   Wt 86.2 kg   SpO2 90%   BMI 25.77 kg/m   Physical Exam Vitals and nursing note reviewed.  Constitutional:      Appearance: He is well-developed and well-nourished.  HENT:     Head: Normocephalic and atraumatic.     Nose: Nose normal.     Mouth/Throat:     Mouth: Mucous membranes are moist.  Eyes:     Conjunctiva/sclera: Conjunctivae normal.  Cardiovascular:     Rate and Rhythm: Normal rate and regular rhythm.     Heart sounds: No murmur heard.   Pulmonary:     Effort: Pulmonary effort is normal. No respiratory distress.     Breath sounds: Normal breath sounds.  Abdominal:     General: Abdomen is flat.     Palpations: Abdomen is soft.     Tenderness: There is no abdominal tenderness.  Musculoskeletal:        General: No  edema. Normal range of motion.     Cervical back: Neck supple.  Skin:    General: Skin is warm and dry.  Neurological:     General: No focal deficit present.     Mental Status: He is alert.  Psychiatric:        Mood and Affect: Mood and affect and mood normal.     ED Results / Procedures / Treatments   Labs (all labs ordered are listed, but only abnormal results are displayed) Labs Reviewed  CBC WITH DIFFERENTIAL/PLATELET - Abnormal; Notable for the following components:      Result Value   WBC 12.8 (*)    Neutro Abs 10.8 (*)    All other components within normal limits  COMPREHENSIVE METABOLIC PANEL - Abnormal; Notable for the following components:   Sodium 127 (*)    Chloride 96 (*)    CO2 21 (*)    Glucose, Bld 103 (*)    BUN 36 (*)    Creatinine, Ser 2.49 (*)    Calcium 7.5 (*)    Albumin 3.1 (*)    AST 83 (*)    ALT 68 (*)    GFR, Estimated 28 (*)    All other components within normal limits  D-DIMER, QUANTITATIVE (NOT AT [facility]) - Abnormal; Notable for the following components:   D-Dimer, Quant 0.70 (*)    All other components within normal limits  LACTATE DEHYDROGENASE - Abnormal; Notable for the following components:   LDH 365 (*)    All other components within normal limits  FERRITIN - Abnormal; Notable for the following components:   Ferritin 1,009 (*)    All other components within normal limits  FIBRINOGEN - Abnormal; Notable for the following components:   Fibrinogen 624 (*)    All other components within normal limits  C-REACTIVE PROTEIN - Abnormal; Notable for the following components:   CRP 6.9 (*)    All other components within normal limits  CULTURE, BLOOD (ROUTINE X 2)  CULTURE, BLOOD (ROUTINE X 2)  LACTIC ACID, PLASMA  PROCALCITONIN  TRIGLYCERIDES  LACTIC ACID, PLASMA    EKG None  Radiology DG Chest Port 1 View  Result Date: 05/22/2020 CLINICAL DATA:  COVID positive.  Shortness of breath and cough. EXAM: PORTABLE CHEST 1 VIEW COMPARISON:   None. FINDINGS: The heart size and mediastinal contours are within normal  limits. Both lungs are clear. The visualized skeletal structures are unremarkable. IMPRESSION: No active disease. Electronically Signed   By: Sherian Rein M.D.   On: 05/22/2020 13:34    Procedures Procedures (including critical care time)  Medications Ordered in ED Medications  albuterol (VENTOLIN HFA) 108 (90 Base) MCG/ACT inhaler 2 puff (2 puffs Inhalation Given 05/22/20 1503)    ED Course  I have reviewed the triage vital signs and the nursing notes.  Pertinent labs & imaging results that were available during my care of the patient were reviewed by me and considered in my medical decision making (see chart for details).    MDM Rules/Calculators/A&P                          Pt more comfortable on oxygen. 02 89 here without 02.  Chest xray no pneumonia.  Pt has elevated bun and creatine.  Hospitalist consulted for admission Final Clinical Impression(s) / ED Diagnoses Final diagnoses:  COVID  Hypoxia  Acute renal failure, unspecified acute renal failure type (HCC)  Hyponatremia    Rx / DC Orders ED Discharge Orders    None    An After Visit Summary was printed and given to the patient.    Elson Areas, [place] 05/22/20 1707    Bethann Berkshire, MD 05/26/20 [phone]

## 2020-05-22 NOTE — ED Notes (Signed)
Pt taken off O2.  Pt sats 92-94% RA

## 2020-05-23 ENCOUNTER — Inpatient Hospital Stay (HOSPITAL_COMMUNITY): Payer: PPO

## 2020-05-23 LAB — HEMOGLOBIN A1C
Hgb A1c MFr Bld: 5.6 % (ref 4.8–5.6)
Mean Plasma Glucose: 114.02 mg/dL

## 2020-05-23 LAB — COMPREHENSIVE METABOLIC PANEL
ALT: 54 U/L — ABNORMAL HIGH (ref 0–44)
AST: 89 U/L — ABNORMAL HIGH (ref 15–41)
Albumin: 2.7 g/dL — ABNORMAL LOW (ref 3.5–5.0)
Alkaline Phosphatase: 45 U/L (ref 38–126)
Anion gap: 9 (ref 5–15)
BUN: 28 mg/dL — ABNORMAL HIGH (ref 8–23)
CO2: 21 mmol/L — ABNORMAL LOW (ref 22–32)
Calcium: 7.3 mg/dL — ABNORMAL LOW (ref 8.9–10.3)
Chloride: 100 mmol/L (ref 98–111)
Creatinine, Ser: 1.62 mg/dL — ABNORMAL HIGH (ref 0.61–1.24)
GFR, Estimated: 47 mL/min — ABNORMAL LOW (ref >60)
Glucose, Bld: 149 mg/dL — ABNORMAL HIGH (ref 70–99)
Potassium: 5.4 mmol/L — ABNORMAL HIGH (ref 3.5–5.1)
Sodium: 130 mmol/L — ABNORMAL LOW (ref 135–145)
Total Bilirubin: 1.6 mg/dL — ABNORMAL HIGH (ref 0.3–1.2)
Total Protein: 5.8 g/dL — ABNORMAL LOW (ref 6.5–8.1)

## 2020-05-23 LAB — C-REACTIVE PROTEIN: CRP: 7.7 mg/dL — ABNORMAL HIGH (ref <1.0)

## 2020-05-23 LAB — CBG MONITORING, ED
Glucose-Capillary: 154 mg/dL — ABNORMAL HIGH (ref 70–99)
Glucose-Capillary: 165 mg/dL — ABNORMAL HIGH (ref 70–99)

## 2020-05-23 LAB — HIV ANTIBODY (ROUTINE TESTING W REFLEX): HIV Screen 4th Generation wRfx: NONREACTIVE

## 2020-05-23 LAB — GLUCOSE, CAPILLARY
Glucose-Capillary: 131 mg/dL — ABNORMAL HIGH (ref 70–99)
Glucose-Capillary: 149 mg/dL — ABNORMAL HIGH (ref 70–99)

## 2020-05-23 LAB — D-DIMER, QUANTITATIVE: D-Dimer, Quant: 0.58 ug/mL-FEU — ABNORMAL HIGH (ref 0.00–0.50)

## 2020-05-23 LAB — POTASSIUM: Potassium: 4.2 mmol/L (ref 3.5–5.1)

## 2020-05-23 MED ORDER — SODIUM ZIRCONIUM CYCLOSILICATE 5 G PO PACK
10.0000 g | PACK | Freq: Once | ORAL | Status: AC
  Administered 2020-05-23: 10 g via ORAL
  Filled 2020-05-23: qty 2

## 2020-05-23 MED ORDER — SODIUM CHLORIDE 0.9 % IV SOLN: INTRAVENOUS | Status: AC

## 2020-05-23 MED ORDER — ORAL CARE MOUTH RINSE
15.0000 mL | Freq: Two times a day (BID) | OROMUCOSAL | Status: DC
  Administered 2020-05-23 – 2020-05-26 (×6): 15 mL via OROMUCOSAL

## 2020-05-23 MED ORDER — LORATADINE 10 MG PO TABS
10.0000 mg | ORAL_TABLET | Freq: Every evening | ORAL | Status: DC
  Administered 2020-05-23 – 2020-05-25 (×3): 10 mg via ORAL
  Filled 2020-05-23 (×3): qty 1

## 2020-05-23 MED ORDER — ALBUTEROL SULFATE HFA 108 (90 BASE) MCG/ACT IN AERS: 1.0000 | INHALATION_SPRAY | Freq: Four times a day (QID) | RESPIRATORY_TRACT | Status: DC | PRN

## 2020-05-23 NOTE — ED Notes (Signed)
Patient's sats drop to 89% when standing on 12 N/C.

## 2020-05-23 NOTE — ED Notes (Signed)
Receiving nurse to call back for report.  

## 2020-05-23 NOTE — ED Notes (Signed)
Pt resting quietly in bed, IVF stopped per [facility]. Denies pain or other needs at this time.

## 2020-05-23 NOTE — Progress Notes (Signed)
PROGRESS NOTE    CHRISTIANJAMES MINSER  [id number] DOB: 1955-06-30 DOA: 05/22/2020 PCP: Susy Frizzle, MD   Brief Narrative:  Per HPI: Daniel Rios is a 65 y.o. male with a history of HTN, HLD, covid-19 diagnosis 05/13/2020 who presented to urgent care earlier today for progressive, worsening symptoms and was found to be hypoxic and hypotensive, sent by EMS to [facility]. He reported increasing fatigue, generalized weakness, severe nausea, poor appetite and poor per oral intake as well as increasingly severe nonproductive cough and fevers regularly over the past few days. His whole family has covid but most have improved. He has taken BP medications including today, and was taking an antibiotic and steroid due to suspected sinus infection PTA.   -Patient admitted with acute hypoxemic respiratory failure in the setting of COVID-19 pneumonia as well as some noted AKI and electrolyte disturbances.  Assessment & Plan:   Active Problems:   COVID-19 virus infection   Acute hypoxemic respiratory failure due to covid-19 pneumonia: SARS-CoV-2 PCR positive on 05/13/20, hypoxic as of 05/22/20. Note CXR on my personal review does have some hazy peripheral-predominant opacities bilaterally and patient certainly has exam evidence of pneumonia. Hypoxia is otherwise unexplained, though if renal function improves, infiltrates do not develop, and hypoxia does not improve and/or d-dimer rises, would have low threshold for CTA chest.  - Duration of illness is ~10 days at this time suggesting marginal benefit of remdesivir, though may still outweigh potential risks. This was discussed with the patient who opts to proceed - Start steroids due to hypoxia. Note CXR on my personal review does have some hazy peripheral-predominant opacities bilaterally and patient certainly has exam evidence of pneumonia.  - No indication for immunomodulator at this time.  - Maximize antitussives.  - DC cardiac monitoring to facilitate  movement, continue to encourage IS.  - Continue airborne, contact precautions - Monitor CMP and inflammatory markers - Enoxaparin prophylactic dose.  - Encouraged to get 1st dose of vaccine between 21 and 90 days from infection Dx.  - Chart biopsy reveals some hyperglycemia, and mention of prediabetes though pt unaware of any such diagnosis. Will cover with SSI while starting steroids and hemoglobin A1c 5.6% -Procalcitonin 0.20, no indication for antibiotics at this time  AKI, NAGMA: Suspected to be prerenal in light of GI losses, poor per oral intake, and also likely direct covid effect. May be related to hypotension which was noted at urgent care but has since improved. Also may be some element of progressive chronic renal disease with HTN and reported prediabetes, though this is not clear.  -Currently improving -Continue IV fluid with normal saline in the setting of hyponatremia  Hyponatremia -Start normal saline and recheck in a.m.  Hypokalemia -Lokelma dose to be given and recheck BMP   LFT elevation: Most likely related to covid directly. -Stable - Continue to monitor.   BPH:  - Continue tamsulosin  HTN: With reported hypotension earlier today and renal injury, will hold norvasc and losartan  Allergic rhinitis:  - Continue atihistamine and flonase.   HLD:  - Continue statin for now, monitor LFTs.    DVT prophylaxis:Lovenox Code Status: Full Family Communication: Discussed with wife on phone 1/3 Disposition Plan:  Status is: Inpatient  Remains inpatient appropriate because:Ongoing diagnostic testing needed not appropriate for outpatient work up, IV treatments appropriate due to intensity of illness or inability to take PO and Inpatient level of care appropriate due to severity of illness   Dispo: The patient is  from: Home              Anticipated d/c is to: Home              Anticipated d/c date is: 2 days              Patient currently is not medically stable  to d/c.  Patient currently requiring 12 L high flow nasal cannula oxygen and requires inpatient stay.  Consultants:   None  Procedures:   See below  Antimicrobials:  Anti-infectives (From admission, onward)   Start     Dose/Rate Route Frequency Ordered Stop   05/23/20 1000  remdesivir 100 mg in sodium chloride 0.9 % 100 mL IVPB       "Followed by" Linked Group Details   100 mg 200 mL/hr over 30 Minutes Intravenous Daily 05/22/20 1723 05/27/20 0959   05/22/20 1730  remdesivir 200 mg in sodium chloride 0.9% 250 mL IVPB       "Followed by" Linked Group Details   200 mg 580 mL/hr over 30 Minutes Intravenous Once 05/22/20 1723 05/22/20 1858       Subjective: Patient seen and evaluated today with no new acute complaints or concerns. No acute concerns or events noted overnight.  He is currently on 2 L nasal cannula oxygen.  He states he has coughing episodes with deep breaths.  Objective: Vitals:   05/23/20 0500 05/23/20 0600 05/23/20 0700 05/23/20 0800  BP: 118/76 121/72 106/66 122/80  Pulse: 69 70 67 74  Resp: (!) 25 (!) 29 (!) 22 (!) 26  Temp:      TempSrc:      SpO2: 94% 93% 94% 93%  Weight:      Height:        Intake/Output Summary (Last 24 hours) at 05/23/2020 1121 Last data filed at 05/22/2020 2325 Gross per 24 hour  Intake 750 ml  Output 1100 ml  Net -350 ml   Filed Weights   05/22/20 1217  Weight: 86.2 kg    Examination:  General exam: Appears calm and comfortable  Respiratory system: Clear to auscultation. Respiratory effort normal.  Currently on 12 L nasal cannula oxygen Cardiovascular system: S1 & S2 heard, RRR.  Gastrointestinal system: Abdomen is soft Central nervous system: Alert and awake Extremities: No edema Skin: No significant lesions noted Psychiatry: Flat affect.    Data Reviewed: I have personally reviewed following labs and imaging studies  CBC: Recent Labs  Lab 05/22/20 1318  WBC 12.8*  NEUTROABS 10.8*  HGB 15.7  HCT 46.2  MCV  94.9  PLT [id number]   Basic Metabolic Panel: Recent Labs  Lab 05/22/20 1318 05/23/20 0515  NA 127* 130*  K 4.0 5.4*  CL 96* 100  CO2 21* 21*  GLUCOSE 103* 149*  BUN 36* 28*  CREATININE 2.49* 1.62*  CALCIUM 7.5* 7.3*   GFR: Estimated Creatinine Clearance: 49.2 mL/min (A) (by C-G formula based on SCr of 1.62 mg/dL (H)). Liver Function Tests: Recent Labs  Lab 05/22/20 1318 05/23/20 0515  AST 83* 89*  ALT 68* 54*  ALKPHOS 49 45  BILITOT 0.3 1.6*  PROT 6.5 5.8*  ALBUMIN 3.1* 2.7*   No results for input(s): LIPASE, AMYLASE in the last 168 hours. No results for input(s): AMMONIA in the last 168 hours. Coagulation Profile: No results for input(s): INR, PROTIME in the last 168 hours. Cardiac Enzymes: No results for input(s): CKTOTAL, CKMB, CKMBINDEX, TROPONINI in the last 168 hours. BNP (last 3 results) No results  for input(s): PROBNP in the last 8760 hours. HbA1C: Recent Labs    05/23/20 0515  HGBA1C 5.6   CBG: Recent Labs  Lab 05/22/20 2104 05/22/20 2230 05/23/20 0752  GLUCAP 98 112* 154*   Lipid Profile: Recent Labs    05/22/20 1318  TRIG 116   Thyroid Function Tests: No results for input(s): TSH, T4TOTAL, FREET4, T3FREE, THYROIDAB in the last 72 hours. Anemia Panel: Recent Labs    05/22/20 1318  FERRITIN 1,009*   Sepsis Labs: Recent Labs  Lab 05/22/20 1318  PROCALCITON 0.20  LATICACIDVEN 1.3    Recent Results (from the past 240 hour(s))  Blood Culture (routine x 2)     Status: None (Preliminary result)   Collection Time: 05/22/20  2:07 PM   Specimen: Left Antecubital; Blood  Result Value Ref Range Status   Specimen Description   Final    LEFT ANTECUBITAL BOTTLES DRAWN AEROBIC AND ANAEROBIC   Special Requests Blood Culture adequate volume  Final   Culture   Final    NO GROWTH < 24 HOURS Performed at [facility], [address] [place], [place] [address]    Report Status PENDING  Incomplete  Blood Culture (routine x 2)     Status: None  (Preliminary result)   Collection Time: 05/22/20  2:09 PM   Specimen: Right Antecubital; Blood  Result Value Ref Range Status   Specimen Description   Final    RIGHT ANTECUBITAL BOTTLES DRAWN AEROBIC AND ANAEROBIC   Special Requests Blood Culture adequate volume  Final   Culture   Final    NO GROWTH < 24 HOURS Performed at [facility], [address], [place], [place] [address]    Report Status PENDING  Incomplete         Radiology Studies: DG CHEST PORT 1 VIEW  Result Date: 05/23/2020 CLINICAL DATA:  COVID pneumonia. EXAM: PORTABLE CHEST 1 VIEW COMPARISON:  05/22/2020 FINDINGS: Interval progression of patchy airspace disease in the right mid lung and right base. More confluent airspace opacity in the periphery of the left lung is also progressive. Cardiopericardial silhouette is at upper limits of normal for size. Telemetry leads overlie the chest. IMPRESSION: Progressive patchy airspace disease bilaterally compatible with multifocal pneumonia. Electronically Signed   By: Kennith Center M.D.   On: 05/23/2020 06:23   DG Chest Port 1 View  Result Date: 05/22/2020 CLINICAL DATA:  COVID positive.  Shortness of breath and cough. EXAM: PORTABLE CHEST 1 VIEW COMPARISON:  None. FINDINGS: The heart size and mediastinal contours are within normal limits. Both lungs are clear. The visualized skeletal structures are unremarkable. IMPRESSION: No active disease. Electronically Signed   By: Sherian Rein M.D.   On: 05/22/2020 13:34        Scheduled Meds: . benzonatate  100 mg Oral TID  . enoxaparin (LOVENOX) injection  40 mg Subcutaneous Q24H  . fluticasone  2 spray Each Nare Daily  . insulin aspart  0-15 Units Subcutaneous TID WC  . insulin aspart  0-5 Units Subcutaneous QHS  . loratadine  10 mg Oral QPM  . methylPREDNISolone (SOLU-MEDROL) injection  80 mg Intravenous BID  . rosuvastatin  20 mg Oral Daily  . sodium chloride flush  3 mL Intravenous Q12H  . tamsulosin  0.4 mg Oral QHS    Continuous Infusions: . sodium chloride    . sodium chloride 75 mL/hr at 05/23/20 0841  . remdesivir 100 mg in NS 100 mL       LOS: 1  day    Time spent: 35 minutes    Sarita Hakanson Darleen Crocker, DO [facility]  If 7PM-7AM, please contact night-coverage [email or web address] 05/23/2020, 11:21 AM

## 2020-05-23 NOTE — ED Notes (Signed)
Pt resting quietly in bed with eyes closed, VS stable. NAD noted. Will continue to monitor.

## 2020-05-24 LAB — COMPREHENSIVE METABOLIC PANEL
ALT: 49 U/L — ABNORMAL HIGH (ref 0–44)
AST: 65 U/L — ABNORMAL HIGH (ref 15–41)
Albumin: 2.5 g/dL — ABNORMAL LOW (ref 3.5–5.0)
Alkaline Phosphatase: 44 U/L (ref 38–126)
Anion gap: 9 (ref 5–15)
BUN: 31 mg/dL — ABNORMAL HIGH (ref 8–23)
CO2: 25 mmol/L (ref 22–32)
Calcium: 7.7 mg/dL — ABNORMAL LOW (ref 8.9–10.3)
Chloride: 103 mmol/L (ref 98–111)
Creatinine, Ser: 1.21 mg/dL (ref 0.61–1.24)
GFR, Estimated: >60 mL/min (ref >60)
Glucose, Bld: 172 mg/dL — ABNORMAL HIGH (ref 70–99)
Potassium: 4.2 mmol/L (ref 3.5–5.1)
Sodium: 137 mmol/L (ref 135–145)
Total Bilirubin: 0.4 mg/dL (ref 0.3–1.2)
Total Protein: 5.8 g/dL — ABNORMAL LOW (ref 6.5–8.1)

## 2020-05-24 LAB — CBC
HCT: 44.2 % (ref 39.0–52.0)
Hemoglobin: 14.8 g/dL (ref 13.0–17.0)
MCH: 32.2 pg (ref 26.0–34.0)
MCHC: 33.5 g/dL (ref 30.0–36.0)
MCV: 96.1 fL (ref 80.0–100.0)
Platelets: 241 10*3/uL (ref 150–400)
RBC: 4.6 MIL/uL (ref 4.22–5.81)
RDW: 13.2 % (ref 11.5–15.5)
WBC: 15.1 10*3/uL — ABNORMAL HIGH (ref 4.0–10.5)
nRBC: 0.2 % (ref 0.0–0.2)

## 2020-05-24 LAB — D-DIMER, QUANTITATIVE: D-Dimer, Quant: 0.41 ug/mL-FEU (ref 0.00–0.50)

## 2020-05-24 LAB — GLUCOSE, CAPILLARY
Glucose-Capillary: 157 mg/dL — ABNORMAL HIGH (ref 70–99)
Glucose-Capillary: 191 mg/dL — ABNORMAL HIGH (ref 70–99)
Glucose-Capillary: 177 mg/dL — ABNORMAL HIGH (ref 70–99)

## 2020-05-24 LAB — C-REACTIVE PROTEIN: CRP: 2.9 mg/dL — ABNORMAL HIGH (ref <1.0)

## 2020-05-24 MED ORDER — PHENOL 1.4 % MT LIQD
1.0000 | OROMUCOSAL | Status: DC | PRN
  Administered 2020-05-24: 1 via OROMUCOSAL
  Filled 2020-05-24: qty 177

## 2020-05-24 NOTE — Plan of Care (Signed)
  Problem: Education: Goal: Knowledge of risk factors and measures for prevention of condition will improve Outcome: Progressing   Problem: Coping: Goal: Psychosocial and spiritual needs will be supported Outcome: Progressing   Problem: Respiratory: Goal: Will maintain a patent airway Outcome: Progressing Goal: Complications related to the disease process, condition or treatment will be avoided or minimized Outcome: Progressing   Problem: Education: Goal: Knowledge of General Education information will improve Description: Including pain rating scale, medication(s)/side effects and non-pharmacologic comfort measures Outcome: Progressing   Problem: Health Behavior/Discharge Planning: Goal: Ability to manage health-related needs will improve Outcome: Progressing   Problem: Clinical Measurements: Goal: Ability to maintain clinical measurements within normal limits will improve Outcome: Progressing Goal: Respiratory complications will improve Outcome: Progressing   Problem: Activity: Goal: Risk for activity intolerance will decrease Outcome: Progressing   Problem: Nutrition: Goal: Adequate nutrition will be maintained Outcome: Progressing   

## 2020-05-24 NOTE — Progress Notes (Signed)
PROGRESS NOTE    Daniel Rios  [id number] DOB: 05/03/1956 DOA: 05/22/2020 PCP: Susy Frizzle, MD   Brief Narrative:  Per HPI: Daniel Rios a 65 y.o.malewith a history ofHTN, HLD, covid-19 diagnosis 05/13/2020 who presented to urgent care earlier today for progressive, worsening symptoms and was found to be hypoxic and hypotensive, sent by EMS to [facility]. He reported increasing fatigue, generalized weakness, severe nausea, poor appetite and poor per oral intake as well as increasingly severe nonproductive cough and fevers regularly over the past few days. His whole family has covid but most have improved. He has taken BP medications including today, and was taking an antibiotic and steroid due to suspected sinus infection PTA.  -Patient admitted with acute hypoxemic respiratory failure in the setting of COVID-19 pneumonia as well as some noted AKI and electrolyte disturbances.  Assessment & Plan:   Active Problems:   COVID-19 virus infection   Acute hypoxemic respiratory failure due to covid-19 pneumonia: SARS-CoV-2 PCR positive on12/24/21, hypoxic as of 05/22/20.Note CXR on my personal review does have some hazy peripheral-predominant opacities bilaterally and patient certainly has exam evidence of pneumonia. Hypoxia is otherwise unexplained, though if renal function improves, infiltrates do not develop, and hypoxia does not improve and/or d-dimer rises, would have low threshold for CTA chest. -Duration of illness is ~10 days at this time suggesting marginal benefit of remdesivir, though may still outweigh potential risks. This was discussed with the patient who opts to proceed -Startsteroids due to hypoxia. Note CXR on my personal review does have some hazy peripheral-predominant opacities bilaterally and patient certainly has exam evidence of pneumonia. -No indication for immunomodulator at this time.  - Maximize antitussives.  - DC cardiac monitoring to facilitate  movement, continue to encourage IS. - Continue airborne, contact precautions - Monitor CMP and inflammatory markers - Enoxaparin prophylactic dose.  - Encouraged to get1st dose ofvaccinebetween 21 and 90 days from infection Dx. - Chart biopsy reveals some hyperglycemia, and mention of prediabetesthough pt unaware of any such diagnosis. Will cover with SSI while starting steroids and hemoglobin A1c 5.6% -Procalcitonin 0.20, no indication for antibiotics at this time  AKI, NAGMA: Suspected to be prerenal in light of GI losses, poor per oral intake, and also likely direct covid effect.May be related to hypotension which was noted at urgent care but has since improved.Also may be some element of progressive chronic renal disease with HTN and reported prediabetes, though this is not clear.  -Currently resolved  Hyponatremia-resolved  Hyperkalemia-resolved  LFT elevation: Most likely related to covid directly. -Stable - Continue to monitor.  BPH:  - Continue tamsulosin  [id number] reported hypotension earlier today and renal injury, will hold norvasc andlosartan  Allergic rhinitis:  - Continue atihistamine and flonase.  HLD:  - Continue statin for now, monitor LFTs.   DVT prophylaxis:Lovenox Code Status: Full Family Communication: Discussed with wife on phone 1/4 Disposition Plan:  Status is: Inpatient  Remains inpatient appropriate because:Ongoing diagnostic testing needed not appropriate for outpatient work up, IV treatments appropriate due to intensity of illness or inability to take PO and Inpatient level of care appropriate due to severity of illness   Dispo: The patient is from: Home  Anticipated d/c is to: Home  Anticipated d/c date is: 2 days  Patient currently is not medically stable to d/c.  Patient currently requiring 12 L high flow nasal cannula oxygen and requires inpatient stay.  Consultants:    None  Procedures:   See below  Antimicrobials:  Anti-infectives (From admission, onward)   Start     Dose/Rate Route Frequency Ordered Stop   05/23/20 1000  remdesivir 100 mg in sodium chloride 0.9 % 100 mL IVPB       "Followed by" Linked Group Details   100 mg 200 mL/hr over 30 Minutes Intravenous Daily 05/22/20 1723 05/27/20 0959   05/22/20 1730  remdesivir 200 mg in sodium chloride 0.9% 250 mL IVPB       "Followed by" Linked Group Details   200 mg 580 mL/hr over 30 Minutes Intravenous Once 05/22/20 1723 05/22/20 1858       Subjective: Patient seen and evaluated today with no new acute complaints or concerns. No acute concerns or events noted overnight.  Objective: Vitals:   05/23/20 1603 05/23/20 2009 05/24/20 0007 05/24/20 0421  BP: (!) 141/78 124/72 119/72 124/67  Pulse: 73 72 66 67  Resp: 18 18 18 20   Temp: 98.2 F (36.8 C) 98.2 F (36.8 C) 98.2 F (36.8 C) 98.6 F (37 C)  TempSrc: Oral Oral Oral Oral  SpO2: 96% 95% 91% 91%  Weight:      Height:        Intake/Output Summary (Last 24 hours) at 05/24/2020 1118 Last data filed at 05/24/2020 0800 Gross per 24 hour  Intake 133.33 ml  Output 1750 ml  Net -1616.67 ml   Filed Weights   05/22/20 1217  Weight: 86.2 kg    Examination:  General exam: Appears calm and comfortable  Respiratory system: Clear to auscultation. Respiratory effort normal.  Continues on 12 L nasal cannula oxygen Cardiovascular system: S1 & S2 heard, RRR.  Gastrointestinal system: Abdomen is soft Central nervous system: Alert and awake Extremities: No edema Skin: No significant lesions noted Psychiatry: Flat affect.    Data Reviewed: I have personally reviewed following labs and imaging studies  CBC: Recent Labs  Lab 05/22/20 1318 05/24/20 0536  WBC 12.8* 15.1*  NEUTROABS 10.8*  --   HGB 15.7 14.8  HCT 46.2 44.2  MCV 94.9 96.1  PLT 192 241   Basic Metabolic Panel: Recent Labs  Lab 05/22/20 1318 05/23/20 0515  05/23/20 1408 05/24/20 0536  NA 127* 130*  --  137  K 4.0 5.4* 4.2 4.2  CL 96* 100  --  103  CO2 21* 21*  --  25  GLUCOSE 103* 149*  --  172*  BUN 36* 28*  --  31*  CREATININE 2.49* 1.62*  --  1.21  CALCIUM 7.5* 7.3*  --  7.7*   GFR: Estimated Creatinine Clearance: 65.9 mL/min (by C-G formula based on SCr of 1.21 mg/dL). Liver Function Tests: Recent Labs  Lab 05/22/20 1318 05/23/20 0515 05/24/20 0536  AST 83* 89* 65*  ALT 68* 54* 49*  ALKPHOS 49 45 44  BILITOT 0.3 1.6* 0.4  PROT 6.5 5.8* 5.8*  ALBUMIN 3.1* 2.7* 2.5*   No results for input(s): LIPASE, AMYLASE in the last 168 hours. No results for input(s): AMMONIA in the last 168 hours. Coagulation Profile: No results for input(s): INR, PROTIME in the last 168 hours. Cardiac Enzymes: No results for input(s): CKTOTAL, CKMB, CKMBINDEX, TROPONINI in the last 168 hours. BNP (last 3 results) No results for input(s): PROBNP in the last 8760 hours. HbA1C: Recent Labs    05/23/20 0515  HGBA1C 5.6   CBG: Recent Labs  Lab 05/23/20 0752 05/23/20 1147 05/23/20 1727 05/23/20 2006 05/24/20 0811  GLUCAP 154* 165* 131* 149* 157*   Lipid Profile: Recent Labs  05/22/20 1318  TRIG 116   Thyroid Function Tests: No results for input(s): TSH, T4TOTAL, FREET4, T3FREE, THYROIDAB in the last 72 hours. Anemia Panel: Recent Labs    05/22/20 1318  FERRITIN 1,009*   Sepsis Labs: Recent Labs  Lab 05/22/20 1318  PROCALCITON 0.20  LATICACIDVEN 1.3    Recent Results (from the past 240 hour(s))  Blood Culture (routine x 2)     Status: None (Preliminary result)   Collection Time: 05/22/20  2:07 PM   Specimen: Left Antecubital; Blood  Result Value Ref Range Status   Specimen Description   Final    LEFT ANTECUBITAL BOTTLES DRAWN AEROBIC AND ANAEROBIC   Special Requests Blood Culture adequate volume  Final   Culture   Final    NO GROWTH < 24 HOURS Performed at [facility], [address], [place], [place] [address]     Report Status PENDING  Incomplete  Blood Culture (routine x 2)     Status: None (Preliminary result)   Collection Time: 05/22/20  2:09 PM   Specimen: Right Antecubital; Blood  Result Value Ref Range Status   Specimen Description   Final    RIGHT ANTECUBITAL BOTTLES DRAWN AEROBIC AND ANAEROBIC   Special Requests Blood Culture adequate volume  Final   Culture   Final    NO GROWTH < 24 HOURS Performed at [facility], [address], [place], [place] [address]    Report Status PENDING  Incomplete         Radiology Studies: DG CHEST PORT 1 VIEW  Result Date: 05/23/2020 CLINICAL DATA:  COVID pneumonia. EXAM: PORTABLE CHEST 1 VIEW COMPARISON:  05/22/2020 FINDINGS: Interval progression of patchy airspace disease in the right mid lung and right base. More confluent airspace opacity in the periphery of the left lung is also progressive. Cardiopericardial silhouette is at upper limits of normal for size. Telemetry leads overlie the chest. IMPRESSION: Progressive patchy airspace disease bilaterally compatible with multifocal pneumonia. Electronically Signed   By: Misty Stanley M.D.   On: 05/23/2020 06:23   DG Chest Port 1 View  Result Date: 05/22/2020 CLINICAL DATA:  COVID positive.  Shortness of breath and cough. EXAM: PORTABLE CHEST 1 VIEW COMPARISON:  None. FINDINGS: The heart size and mediastinal contours are within normal limits. Both lungs are clear. The visualized skeletal structures are unremarkable. IMPRESSION: No active disease. Electronically Signed   By: Abelardo Diesel M.D.   On: 05/22/2020 13:34        Scheduled Meds: . benzonatate  100 mg Oral TID  . enoxaparin (LOVENOX) injection  40 mg Subcutaneous Q24H  . fluticasone  2 spray Each Nare Daily  . insulin aspart  0-15 Units Subcutaneous TID WC  . insulin aspart  0-5 Units Subcutaneous QHS  . loratadine  10 mg Oral QPM  . mouth rinse  15 mL Mouth Rinse BID  . methylPREDNISolone (SOLU-MEDROL) injection  80 mg Intravenous BID  .  rosuvastatin  20 mg Oral Daily  . sodium chloride flush  3 mL Intravenous Q12H  . tamsulosin  0.4 mg Oral QHS   Continuous Infusions: . remdesivir 100 mg in NS 100 mL 100 mg (05/24/20 0902)     LOS: 2 days    Time spent: 35 minutes    Illene Sweeting Darleen Crocker, DO [facility]  If 7PM-7AM, please contact night-coverage [email or web address] 05/24/2020, 11:18 AM

## 2020-05-25 DIAGNOSIS — E871 Hypo-osmolality and hyponatremia: Secondary | ICD-10-CM

## 2020-05-25 DIAGNOSIS — J069 Acute upper respiratory infection, unspecified: Secondary | ICD-10-CM

## 2020-05-25 DIAGNOSIS — N179 Acute kidney failure, unspecified: Secondary | ICD-10-CM

## 2020-05-25 LAB — CBC
HCT: 43.9 % (ref 39.0–52.0)
Hemoglobin: 14.6 g/dL (ref 13.0–17.0)
MCH: 31.7 pg (ref 26.0–34.0)
MCHC: 33.3 g/dL (ref 30.0–36.0)
MCV: 95.2 fL (ref 80.0–100.0)
Platelets: 262 10*3/uL (ref 150–400)
RBC: 4.61 MIL/uL (ref 4.22–5.81)
RDW: 13 % (ref 11.5–15.5)
WBC: 17 10*3/uL — ABNORMAL HIGH (ref 4.0–10.5)
nRBC: 0 % (ref 0.0–0.2)

## 2020-05-25 LAB — COMPREHENSIVE METABOLIC PANEL
ALT: 55 U/L — ABNORMAL HIGH (ref 0–44)
AST: 69 U/L — ABNORMAL HIGH (ref 15–41)
Albumin: 2.5 g/dL — ABNORMAL LOW (ref 3.5–5.0)
Alkaline Phosphatase: 46 U/L (ref 38–126)
Anion gap: 8 (ref 5–15)
BUN: 37 mg/dL — ABNORMAL HIGH (ref 8–23)
CO2: 25 mmol/L (ref 22–32)
Calcium: 7.4 mg/dL — ABNORMAL LOW (ref 8.9–10.3)
Chloride: 104 mmol/L (ref 98–111)
Creatinine, Ser: 1.17 mg/dL (ref 0.61–1.24)
GFR, Estimated: >60 mL/min (ref >60)
Glucose, Bld: 147 mg/dL — ABNORMAL HIGH (ref 70–99)
Potassium: 4.5 mmol/L (ref 3.5–5.1)
Sodium: 137 mmol/L (ref 135–145)
Total Bilirubin: 0.7 mg/dL (ref 0.3–1.2)
Total Protein: 5.6 g/dL — ABNORMAL LOW (ref 6.5–8.1)

## 2020-05-25 LAB — GLUCOSE, CAPILLARY
Glucose-Capillary: 125 mg/dL — ABNORMAL HIGH (ref 70–99)
Glucose-Capillary: 131 mg/dL — ABNORMAL HIGH (ref 70–99)
Glucose-Capillary: 145 mg/dL — ABNORMAL HIGH (ref 70–99)
Glucose-Capillary: 174 mg/dL — ABNORMAL HIGH (ref 70–99)
Glucose-Capillary: 194 mg/dL — ABNORMAL HIGH (ref 70–99)

## 2020-05-25 LAB — D-DIMER, QUANTITATIVE: D-Dimer, Quant: 0.41 ug/mL-FEU (ref 0.00–0.50)

## 2020-05-25 LAB — C-REACTIVE PROTEIN: CRP: 0.9 mg/dL (ref <1.0)

## 2020-05-25 NOTE — Progress Notes (Signed)
PROGRESS NOTE    Daniel Rios  MHD:[id number] DOB: 1956/03/14 DOA: 05/22/2020 PCP: Donita Brooks, MD   Brief Narrative:  Per HPI: [name] a 65 y.o.malewith a history ofHTN, HLD, covid-19 diagnosis 05/13/2020 who presented to urgent care earlier today for progressive, worsening symptoms and was found to be hypoxic and hypotensive, sent by EMS to [facility]. He reported increasing fatigue, generalized weakness, severe nausea, poor appetite and poor per oral intake as well as increasingly severe nonproductive cough and fevers regularly over the past few days. His whole family has covid but most have improved. He has taken BP medications including today, and was taking an antibiotic and steroid due to suspected sinus infection PTA.  -Patient admitted with acute hypoxemic respiratory failure in the setting of COVID-19 pneumonia as well as some noted AKI and electrolyte disturbances.  Assessment & Plan:   Active Problems:   COVID-19 virus infection   Acute hypoxemic respiratory failure due to covid-19 pneumonia: SARS-CoV-2 PCR positive on12/24/21, hypoxic as of 05/22/20.Note CXR on my personal review does have some hazy peripheral-predominant opacities bilaterally and patient certainly has exam evidence of pneumonia. Hypoxia is otherwise unexplained, though if renal function improves, infiltrates do not develop, and hypoxia does not improve and/or d-dimer rises, would have low threshold for CTA chest. -Duration of illness is ~10 days at this time suggesting marginal benefit of remdesivir, though may still outweigh potential risks. This was discussed with the patient who opts to proceed -Currently onsteroids due to hypoxia. Note CXR on my personal review does have some hazy peripheral-predominant opacities bilaterally and patient certainly has exam evidence of pneumonia. -No indication for immunomodulator at this time.  - Maximize antitussives.  - DC cardiac monitoring to  facilitate movement, continue to encourage IS. - Continue airborne, contact precautions - Monitor CMP and inflammatory markers - Enoxaparin prophylactic dose.  - Encouraged to get1st dose ofvaccinebetween 21 and 90 days from infection Dx. - Chart biopsy reveals some hyperglycemia, and mention of prediabetesthough pt unaware of any such diagnosis. Will cover with SSI while starting steroids and hemoglobin A1c 5.6% -Procalcitonin 0.20, no indication for antibiotics at this time  AKI, NAGMA: Suspected to be prerenal in light of GI losses, poor per oral intake, and also likely direct covid effect.May be related to hypotension which was noted at urgent care but has since improved.Also may be some element of progressive chronic renal disease with HTN and reported prediabetes, though this is not clear.  -Currently resolved  Hyponatremia-resolved  Hyperkalemia-resolved  LFT elevation: Most likely related to covid directly. -Stable - Continue to monitor.  BPH:  - Continue tamsulosin  QJJ:HERD reported hypotension and renal injury, will hold norvasc andlosartan  Allergic rhinitis:  - Continue atihistamine and flonase.  HLD:  - Continue statin for now, monitor LFTs.  Thus far, LFTs have been stable  DVT prophylaxis:Lovenox Code Status: Full Family Communication: Discussed with wife on phone 1/4 Disposition Plan:  Status is: Inpatient  Remains inpatient appropriate because:Ongoing diagnostic testing needed not appropriate for outpatient work up, IV treatments appropriate due to intensity of illness or inability to take PO and Inpatient level of care appropriate due to severity of illness   Dispo: The patient is from: Home  Anticipated d/c is to: Home  Anticipated d/c date is: 2 days  Patient currently is not medically stable to d/c.  Patient currently requiring 7 L high flow nasal cannula oxygen and requires inpatient  stay.  Consultants:   None  Procedures:  See below  Antimicrobials:  Anti-infectives (From admission, onward)   Start     Dose/Rate Route Frequency Ordered Stop   05/23/20 1000  remdesivir 100 mg in sodium chloride 0.9 % 100 mL IVPB       "Followed by" Linked Group Details   100 mg 200 mL/hr over 30 Minutes Intravenous Daily 05/22/20 1723 05/27/20 0959   05/22/20 1730  remdesivir 200 mg in sodium chloride 0.9% 250 mL IVPB       "Followed by" Linked Group Details   200 mg 580 mL/hr over 30 Minutes Intravenous Once 05/22/20 1723 05/22/20 1858      Subjective: Reports that he is feeling better today.  Overall dyspnea on exertion is improving.  He does have a productive cough  Objective: Vitals:   05/24/20 2153 05/25/20 0035 05/25/20 0546 05/25/20 1550  BP: 127/86 130/79 110/63 111/61  Pulse: 74 78 72 76  Resp: 20 20 20 19   Temp: 97.7 F (36.5 C)  98.2 F (36.8 C) 98.6 F (37 C)  TempSrc: Oral  Oral   SpO2:  90% 90% 92%  Weight:      Height:        Intake/Output Summary (Last 24 hours) at 05/25/2020 2037 Last data filed at 05/25/2020 1700 Gross per 24 hour  Intake 240 ml  Output 500 ml  Net -260 ml   Filed Weights   05/22/20 1217  Weight: 86.2 kg    Examination:  General exam: Alert, awake, oriented x 3 Respiratory system: Clear to auscultation. Respiratory effort normal. Cardiovascular system:RRR. No murmurs, rubs, gallops. Gastrointestinal system: Abdomen is nondistended, soft and nontender. No organomegaly or masses felt. Normal bowel sounds heard. Central nervous system: Alert and oriented. No focal neurological deficits. Extremities: No C/C/E, +pedal pulses Skin: No rashes, lesions or ulcers Psychiatry: Judgement and insight appear normal. Mood & affect appropriate.      Data Reviewed: I have personally reviewed following labs and imaging studies  CBC: Recent Labs  Lab 05/22/20 1318 05/24/20 0536 05/25/20 0620  WBC 12.8* 15.1* 17.0*   NEUTROABS 10.8*  --   --   HGB 15.7 14.8 14.6  HCT 46.2 44.2 43.9  MCV 94.9 96.1 95.2  PLT 192 241 [id number]   Basic Metabolic Panel: Recent Labs  Lab 05/22/20 1318 05/23/20 0515 05/23/20 1408 05/24/20 0536 05/25/20 0620  NA 127* 130*  --  137 137  K 4.0 5.4* 4.2 4.2 4.5  CL 96* 100  --  103 104  CO2 21* 21*  --  25 25  GLUCOSE 103* 149*  --  172* 147*  BUN 36* 28*  --  31* 37*  CREATININE 2.49* 1.62*  --  1.21 1.17  CALCIUM 7.5* 7.3*  --  7.7* 7.4*   GFR: Estimated Creatinine Clearance: 68.2 mL/min (by C-G formula based on SCr of 1.17 mg/dL). Liver Function Tests: Recent Labs  Lab 05/22/20 1318 05/23/20 0515 05/24/20 0536 05/25/20 0620  AST 83* 89* 65* 69*  ALT 68* 54* 49* 55*  ALKPHOS 49 45 44 46  BILITOT 0.3 1.6* 0.4 0.7  PROT 6.5 5.8* 5.8* 5.6*  ALBUMIN 3.1* 2.7* 2.5* 2.5*   No results for input(s): LIPASE, AMYLASE in the last 168 hours. No results for input(s): AMMONIA in the last 168 hours. Coagulation Profile: No results for input(s): INR, PROTIME in the last 168 hours. Cardiac Enzymes: No results for input(s): CKTOTAL, CKMB, CKMBINDEX, TROPONINI in the last 168 hours. BNP (last 3 results) No results for input(s): PROBNP  in the last 8760 hours. HbA1C: Recent Labs    05/23/20 0515  HGBA1C 5.6   CBG: Recent Labs  Lab 05/24/20 1636 05/25/20 0030 05/25/20 0753 05/25/20 1216 05/25/20 1645  GLUCAP 177* 145* 125* 194* 174*   Lipid Profile: No results for input(s): CHOL, HDL, LDLCALC, TRIG, CHOLHDL, LDLDIRECT in the last 72 hours. Thyroid Function Tests: No results for input(s): TSH, T4TOTAL, FREET4, T3FREE, THYROIDAB in the last 72 hours. Anemia Panel: No results for input(s): VITAMINB12, FOLATE, FERRITIN, TIBC, IRON, RETICCTPCT in the last 72 hours. Sepsis Labs: Recent Labs  Lab 05/22/20 1318  PROCALCITON 0.20  LATICACIDVEN 1.3    Recent Results (from the past 240 hour(s))  Blood Culture (routine x 2)     Status: None (Preliminary result)    Collection Time: 05/22/20  2:07 PM   Specimen: Left Antecubital; Blood  Result Value Ref Range Status   Specimen Description   Final    LEFT ANTECUBITAL BOTTLES DRAWN AEROBIC AND ANAEROBIC   Special Requests Blood Culture adequate volume  Final   Culture   Final    NO GROWTH 3 DAYS Performed at [facility], [address], [place], [place] [address]    Report Status PENDING  Incomplete  Blood Culture (routine x 2)     Status: None (Preliminary result)   Collection Time: 05/22/20  2:09 PM   Specimen: Right Antecubital; Blood  Result Value Ref Range Status   Specimen Description   Final    RIGHT ANTECUBITAL BOTTLES DRAWN AEROBIC AND ANAEROBIC   Special Requests Blood Culture adequate volume  Final   Culture   Final    NO GROWTH 3 DAYS Performed at [facility], [address] [place],  [address]    Report Status PENDING  Incomplete         Radiology Studies: No results found.      Scheduled Meds: . benzonatate  100 mg Oral TID  . enoxaparin (LOVENOX) injection  40 mg Subcutaneous Q24H  . fluticasone  2 spray Each Nare Daily  . insulin aspart  0-15 Units Subcutaneous TID WC  . insulin aspart  0-5 Units Subcutaneous QHS  . loratadine  10 mg Oral QPM  . mouth rinse  15 mL Mouth Rinse BID  . methylPREDNISolone (SOLU-MEDROL) injection  80 mg Intravenous BID  . rosuvastatin  20 mg Oral Daily  . sodium chloride flush  3 mL Intravenous Q12H  . tamsulosin  0.4 mg Oral QHS   Continuous Infusions: . remdesivir 100 mg in NS 100 mL 100 mg (05/25/20 0831)     LOS: 3 days    Time spent: 34 minutes    Kathie Dike, MD [facility]  If 7PM-7AM, please contact night-coverage [email or web address] 05/25/2020, 8:37 PM

## 2020-05-25 NOTE — Plan of Care (Signed)
  Problem: Education: Goal: Knowledge of risk factors and measures for prevention of condition will improve Outcome: Progressing   Problem: Coping: Goal: Psychosocial and spiritual needs will be supported Outcome: Progressing   Problem: Respiratory: Goal: Will maintain a patent airway Outcome: Progressing Goal: Complications related to the disease process, condition or treatment will be avoided or minimized Outcome: Progressing   Problem: Education: Goal: Knowledge of General Education information will improve Description: Including pain rating scale, medication(s)/side effects and non-pharmacologic comfort measures Outcome: Progressing   Problem: Health Behavior/Discharge Planning: Goal: Ability to manage health-related needs will improve Outcome: Progressing   Problem: Clinical Measurements: Goal: Ability to maintain clinical measurements within normal limits will improve Outcome: Progressing Goal: Respiratory complications will improve Outcome: Progressing   Problem: Activity: Goal: Risk for activity intolerance will decrease Outcome: Progressing   Problem: Nutrition: Goal: Adequate nutrition will be maintained Outcome: Progressing

## 2020-05-26 LAB — COMPREHENSIVE METABOLIC PANEL
ALT: 62 U/L — ABNORMAL HIGH (ref 0–44)
AST: 63 U/L — ABNORMAL HIGH (ref 15–41)
Albumin: 2.5 g/dL — ABNORMAL LOW (ref 3.5–5.0)
Alkaline Phosphatase: 47 U/L (ref 38–126)
Anion gap: 9 (ref 5–15)
BUN: 39 mg/dL — ABNORMAL HIGH (ref 8–23)
CO2: 26 mmol/L (ref 22–32)
Calcium: 7.6 mg/dL — ABNORMAL LOW (ref 8.9–10.3)
Chloride: 106 mmol/L (ref 98–111)
Creatinine, Ser: 1.1 mg/dL (ref 0.61–1.24)
GFR, Estimated: >60 mL/min (ref >60)
Glucose, Bld: 143 mg/dL — ABNORMAL HIGH (ref 70–99)
Potassium: 4.5 mmol/L (ref 3.5–5.1)
Sodium: 141 mmol/L (ref 135–145)
Total Bilirubin: 0.7 mg/dL (ref 0.3–1.2)
Total Protein: 5.5 g/dL — ABNORMAL LOW (ref 6.5–8.1)

## 2020-05-26 LAB — C-REACTIVE PROTEIN: CRP: 0.6 mg/dL (ref <1.0)

## 2020-05-26 LAB — GLUCOSE, CAPILLARY
Glucose-Capillary: 144 mg/dL — ABNORMAL HIGH (ref 70–99)
Glucose-Capillary: 177 mg/dL — ABNORMAL HIGH (ref 70–99)

## 2020-05-26 LAB — D-DIMER, QUANTITATIVE: D-Dimer, Quant: 0.65 ug/mL-FEU — ABNORMAL HIGH (ref 0.00–0.50)

## 2020-05-26 MED ORDER — ALBUTEROL SULFATE HFA 108 (90 BASE) MCG/ACT IN AERS: 1.0000 | INHALATION_SPRAY | Freq: Four times a day (QID) | RESPIRATORY_TRACT | 0 refills | Status: DC | PRN

## 2020-05-26 MED ORDER — BENZONATATE 100 MG PO CAPS: 100.0000 mg | ORAL_CAPSULE | Freq: Three times a day (TID) | ORAL | 0 refills | Status: DC

## 2020-05-26 MED ORDER — PREDNISONE 50 MG PO TABS: 50.0000 mg | ORAL_TABLET | Freq: Every day | ORAL | 0 refills | Status: DC

## 2020-05-26 MED ORDER — HYDROCOD POLST-CPM POLST ER 10-8 MG/5ML PO SUER: 5.0000 mL | Freq: Two times a day (BID) | ORAL | 0 refills | Status: DC | PRN

## 2020-05-26 NOTE — Discharge Summary (Signed)
Physician Discharge Summary  Daniel Rios [id number] DOB: 1956-05-04 DOA: 05/22/2020  PCP: Susy Frizzle, MD  Admit date: 05/22/2020 Discharge date: 05/26/2020  Admitted From: Home Disposition: Home  Recommendations for Outpatient Follow-up:  1. Follow up with PCP in 1-2 weeks 2. Please obtain BMP/CBC in one week  Home Health: Equipment/Devices: Oxygen at 4 L  Discharge Condition: Stable CODE STATUS: Full code Diet recommendation: Heart healthy  Brief/Interim Summary: 65 year old male with history of hypertension, hyperlipidemia who was diagnosed with COVID-19 on 12/24, presented to the hospital with progressive worsening symptoms.  Was found to be hypoxic and hypotensive.  He was admitted to the hospital respiratory failure secondary to COVID-19 pneumonia.  Discharge Diagnoses:  Active Problems:   COVID-19 virus infection   Acute hypoxemic respiratory failure due to covid-19 pneumonia: SARS-CoV-2 PCR positive on12/24/21, hypoxic as of 05/22/20.Note CXR on my personal review does have some hazy peripheral-predominant opacities bilaterally and patient certainly has exam evidence of pneumonia.  -Patient completed course of intravenous remdesivir in the hospital -He was also treated with intravenous steroids -No indication for immunomodulator at this time.  -Markers have trended down - Enoxaparin prophylactic dose.  - Encouraged to get1st dose ofvaccinebetween 21 and 90 days from infection Dx. -Clinically, he has improved and is breathing comfortably on 4 L of oxygen -He will be discharged on the same -We will complete a course of prednisone at home  AKI, NAGMA: Suspected to be prerenal in light of GI losses, poor per oral intake, and also likely direct covid effect.May be related to hypotension which was noted at urgent care but has since improved.Also may be some element of progressive chronic renal disease with HTN and reported prediabetes, though this is not  clear. -Currently resolved  Hyponatremia-resolved  Hyperkalemia-resolved  LFT elevation: Most likely related to covid directly. -Stable - Continue to monitor.  BPH:  - Continue tamsulosin  [id number] reported hypotension and renal injury, will hold norvasc andlosartan  Allergic rhinitis:  - Continue atihistamine and flonase.  HLD:  - Continue statin for now, monitor LFTs.  Thus far, LFTs have been stable  Discharge Instructions  Discharge Instructions    Diet - low sodium heart healthy   Complete by: As directed    Increase activity slowly   Complete by: As directed      Allergies as of 05/26/2020      Reactions   Celebrex [celecoxib] Swelling, Rash      Medication List    STOP taking these medications   losartan 100 MG tablet Commonly known as: COZAAR     TAKE these medications   albuterol 108 (90 Base) MCG/ACT inhaler Commonly known as: VENTOLIN HFA Inhale 1 puff into the lungs every 6 (six) hours as needed for wheezing or shortness of breath.   amLODipine 5 MG tablet Commonly known as: NORVASC TAKE 1 TABLET(5 MG) BY MOUTH DAILY What changed:   how much to take  how to take this  when to take this  additional instructions   aspirin 81 MG tablet Take 81 mg by mouth daily.   benzonatate 100 MG capsule Commonly known as: TESSALON Take 1 capsule (100 mg total) by mouth 3 (three) times daily.   chlorpheniramine-HYDROcodone 10-8 MG/5ML Suer Commonly known as: TUSSIONEX Take 5 mLs by mouth every 12 (twelve) hours as needed for cough.   fluticasone 50 MCG/ACT nasal spray Commonly known as: FLONASE Place 2 sprays into both nostrils daily.   levocetirizine 5 MG tablet Commonly known as:  Xyzal Take 1 tablet (5 mg total) by mouth every evening.   multivitamin tablet Take 1 tablet by mouth daily. One a day 50+   predniSONE 50 MG tablet Commonly known as: DELTASONE Take 1 tablet (50 mg total) by mouth daily.   rosuvastatin 20 MG  tablet Commonly known as: CRESTOR TAKE 1 TABLET(20 MG) BY MOUTH DAILY What changed: See the new instructions.   tamsulosin 0.4 MG Caps capsule Commonly known as: FLOMAX TAKE 1 CAPSULE BY MOUTH EVERY NIGHT AT BEDTIME What changed:   how much to take  how to take this  when to take this  additional instructions       Allergies  Allergen Reactions  . Celebrex [Celecoxib] Swelling and Rash    Consultations:     Procedures/Studies: DG CHEST PORT 1 VIEW  Result Date: 05/23/2020 CLINICAL DATA:  COVID pneumonia. EXAM: PORTABLE CHEST 1 VIEW COMPARISON:  05/22/2020 FINDINGS: Interval progression of patchy airspace disease in the right mid lung and right base. More confluent airspace opacity in the periphery of the left lung is also progressive. Cardiopericardial silhouette is at upper limits of normal for size. Telemetry leads overlie the chest. IMPRESSION: Progressive patchy airspace disease bilaterally compatible with multifocal pneumonia. Electronically Signed   By: Misty Stanley M.D.   On: 05/23/2020 06:23   DG Chest Port 1 View  Result Date: 05/22/2020 CLINICAL DATA:  COVID positive.  Shortness of breath and cough. EXAM: PORTABLE CHEST 1 VIEW COMPARISON:  None. FINDINGS: The heart size and mediastinal contours are within normal limits. Both lungs are clear. The visualized skeletal structures are unremarkable. IMPRESSION: No active disease. Electronically Signed   By: Abelardo Diesel M.D.   On: 05/22/2020 13:34       Subjective: Feeling better. Able to ambulate. Continues to have cough  Discharge Exam: Vitals:   05/25/20 2213 05/25/20 2335 05/26/20 0550 05/26/20 1349  BP: (!) 144/83  (!) 143/80 135/82  Pulse: 83 84 80 79  Resp: 20  19 20   Temp: 98.8 F (37.1 C)   98.5 F (36.9 C)  TempSrc: Oral   Oral  SpO2: (!) 84% 90% 90% 91%  Weight:      Height:        General: Pt is alert, awake, not in acute distress Cardiovascular: RRR, S1/S2 +, no rubs, no  gallops Respiratory: CTA bilaterally, no wheezing, no rhonchi Abdominal: Soft, NT, ND, bowel sounds + Extremities: no edema, no cyanosis    The results of significant diagnostics from this hospitalization (including imaging, microbiology, ancillary and laboratory) are listed below for reference.     Microbiology: Recent Results (from the past 240 hour(s))  Blood Culture (routine x 2)     Status: None (Preliminary result)   Collection Time: 05/22/20  2:07 PM   Specimen: Left Antecubital; Blood  Result Value Ref Range Status   Specimen Description   Final    LEFT ANTECUBITAL BOTTLES DRAWN AEROBIC AND ANAEROBIC   Special Requests Blood Culture adequate volume  Final   Culture   Final    NO GROWTH 4 DAYS Performed at [facility], [address] [place], [place] [address]    Report Status PENDING  Incomplete  Blood Culture (routine x 2)     Status: None (Preliminary result)   Collection Time: 05/22/20  2:09 PM   Specimen: Right Antecubital; Blood  Result Value Ref Range Status   Specimen Description   Final    RIGHT ANTECUBITAL BOTTLES DRAWN AEROBIC AND ANAEROBIC  Special Requests Blood Culture adequate volume  Final   Culture   Final    NO GROWTH 4 DAYS Performed at [facility], [address], [place], [place] [address]    Report Status PENDING  Incomplete     Labs: BNP (last 3 results) No results for input(s): BNP in the last 8760 hours. Basic Metabolic Panel: Recent Labs  Lab 05/22/20 1318 05/23/20 0515 05/23/20 1408 05/24/20 0536 05/25/20 0620 05/26/20 0740  NA 127* 130*  --  137 137 141  K 4.0 5.4* 4.2 4.2 4.5 4.5  CL 96* 100  --  103 104 106  CO2 21* 21*  --  25 25 26   GLUCOSE 103* 149*  --  172* 147* 143*  BUN 36* 28*  --  31* 37* 39*  CREATININE 2.49* 1.62*  --  1.21 1.17 1.10  CALCIUM 7.5* 7.3*  --  7.7* 7.4* 7.6*   Liver Function Tests: Recent Labs  Lab 05/22/20 1318 05/23/20 0515 05/24/20 0536 05/25/20 0620 05/26/20 0740  AST 83* 89* 65* 69*  63*  ALT 68* 54* 49* 55* 62*  ALKPHOS 49 45 44 46 47  BILITOT 0.3 1.6* 0.4 0.7 0.7  PROT 6.5 5.8* 5.8* 5.6* 5.5*  ALBUMIN 3.1* 2.7* 2.5* 2.5* 2.5*   No results for input(s): LIPASE, AMYLASE in the last 168 hours. No results for input(s): AMMONIA in the last 168 hours. CBC: Recent Labs  Lab 05/22/20 1318 05/24/20 0536 05/25/20 0620  WBC 12.8* 15.1* 17.0*  NEUTROABS 10.8*  --   --   HGB 15.7 14.8 14.6  HCT 46.2 44.2 43.9  MCV 94.9 96.1 95.2  PLT 192 241 262   Cardiac Enzymes: No results for input(s): CKTOTAL, CKMB, CKMBINDEX, TROPONINI in the last 168 hours. BNP: Invalid input(s): POCBNP CBG: Recent Labs  Lab 05/25/20 1216 05/25/20 1645 05/25/20 2215 05/26/20 0835 05/26/20 1115  GLUCAP 194* 174* 131* 144* 177*   D-Dimer Recent Labs    05/25/20 0620 05/26/20 0740  DDIMER 0.41 0.65*   Hgb A1c No results for input(s): HGBA1C in the last 72 hours. Lipid Profile No results for input(s): CHOL, HDL, LDLCALC, TRIG, CHOLHDL, LDLDIRECT in the last 72 hours. Thyroid function studies No results for input(s): TSH, T4TOTAL, T3FREE, THYROIDAB in the last 72 hours.  Invalid input(s): FREET3 Anemia work up No results for input(s): VITAMINB12, FOLATE, FERRITIN, TIBC, IRON, RETICCTPCT in the last 72 hours. Urinalysis    Component Value Date/Time   COLORURINE YELLOW 05/22/2020 1704   APPEARANCEUR HAZY (A) 05/22/2020 1704   LABSPEC 1.008 05/22/2020 1704   PHURINE 5.0 05/22/2020 1704   GLUCOSEU NEGATIVE 05/22/2020 1704   HGBUR NEGATIVE 05/22/2020 1704   BILIRUBINUR NEGATIVE 05/22/2020 1704   KETONESUR 5 (A) 05/22/2020 1704   PROTEINUR NEGATIVE 05/22/2020 1704   NITRITE NEGATIVE 05/22/2020 1704   LEUKOCYTESUR NEGATIVE 05/22/2020 1704   Sepsis Labs Invalid input(s): PROCALCITONIN,  WBC,  LACTICIDVEN Microbiology Recent Results (from the past 240 hour(s))  Blood Culture (routine x 2)     Status: None (Preliminary result)   Collection Time: 05/22/20  2:07 PM   Specimen:  Left Antecubital; Blood  Result Value Ref Range Status   Specimen Description   Final    LEFT ANTECUBITAL BOTTLES DRAWN AEROBIC AND ANAEROBIC   Special Requests Blood Culture adequate volume  Final   Culture   Final    NO GROWTH 4 DAYS Performed at [facility], [address], [place], [place] [address]    Report Status PENDING  Incomplete  Blood Culture (routine x 2)     Status: None (Preliminary result)   Collection Time: 05/22/20  2:09 PM   Specimen: Right Antecubital; Blood  Result Value Ref Range Status   Specimen Description   Final    RIGHT ANTECUBITAL BOTTLES DRAWN AEROBIC AND ANAEROBIC   Special Requests Blood Culture adequate volume  Final   Culture   Final    NO GROWTH 4 DAYS Performed at [facility], [address], [place], [place] [address]    Report Status PENDING  Incomplete     Time coordinating discharge:  SIGNED:   Erick Blinks, MD  [facility] 05/26/2020, 11:06 PM   If 7PM-7AM, please contact night-coverage [email or web address]

## 2020-05-26 NOTE — Plan of Care (Signed)
  Problem: Education: Goal: Knowledge of risk factors and measures for prevention of condition will improve 05/26/2020 1351 by Sheela Stack, RN Outcome: Adequate for Discharge 05/26/2020 0737 by Sheela Stack, RN Outcome: Progressing   Problem: Coping: Goal: Psychosocial and spiritual needs will be supported 05/26/2020 1351 by Sheela Stack, RN Outcome: Adequate for Discharge 05/26/2020 0737 by Sheela Stack, RN Outcome: Progressing   Problem: Respiratory: Goal: Will maintain a patent airway 05/26/2020 1351 by Sheela Stack, RN Outcome: Adequate for Discharge 05/26/2020 0737 by Sheela Stack, RN Outcome: Progressing Goal: Complications related to the disease process, condition or treatment will be avoided or minimized 05/26/2020 1351 by Sheela Stack, RN Outcome: Adequate for Discharge 05/26/2020 0737 by Sheela Stack, RN Outcome: Progressing   Problem: Education: Goal: Knowledge of General Education information will improve Description: Including pain rating scale, medication(s)/side effects and non-pharmacologic comfort measures 05/26/2020 1351 by Sheela Stack, RN Outcome: Adequate for Discharge 05/26/2020 0737 by Sheela Stack, RN Outcome: Progressing   Problem: Health Behavior/Discharge Planning: Goal: Ability to manage health-related needs will improve 05/26/2020 1351 by Sheela Stack, RN Outcome: Adequate for Discharge 05/26/2020 0737 by Sheela Stack, RN Outcome: Progressing   Problem: Clinical Measurements: Goal: Ability to maintain clinical measurements within normal limits will improve 05/26/2020 1351 by Sheela Stack, RN Outcome: Adequate for Discharge 05/26/2020 0737 by Sheela Stack, RN Outcome: Progressing Goal: Respiratory complications will improve 05/26/2020 1351 by Sheela Stack, RN Outcome: Adequate for Discharge 05/26/2020 0737 by Sheela Stack, RN Outcome: Progressing   Problem: Activity: Goal: Risk for activity intolerance will  decrease 05/26/2020 1351 by Sheela Stack, RN Outcome: Adequate for Discharge 05/26/2020 0737 by Sheela Stack, RN Outcome: Progressing   Problem: Nutrition: Goal: Adequate nutrition will be maintained 05/26/2020 1351 by Sheela Stack, RN Outcome: Adequate for Discharge 05/26/2020 0737 by Sheela Stack, RN Outcome: Progressing

## 2020-05-26 NOTE — TOC Initial Note (Signed)
Transition of Care [facility]) - Initial/Assessment Note    Patient Details  Name: Daniel Rios MRN: [id number] Date of Birth: 1956-03-31  Transition of Care [facility]) CM/SW Contact:    Annice Needy, LCSW Phone Number: 05/26/2020, 1:56 PM  Clinical Narrative:                 Patient from home. Admitted COVID+. In need of home oxygen. DME suppliers discussed. Referral made to [facility].   Expected Discharge Plan: Home/Self Care Barriers to Discharge: No Barriers Identified   Patient Goals and CMS Choice Patient states their goals for this hospitalization and ongoing recovery are:: return home      Expected Discharge Plan and Services Expected Discharge Plan: Home/Self Care     Post Acute Care Choice: Durable Medical Equipment Living arrangements for the past 2 months: Single Family Home                   DME Agency: [employer] Date DME Agency Contacted: 05/26/20 Time DME Agency Contacted: 1356 Representative spoke with at DME Agency: [facility]            Prior Living Arrangements/Services Living arrangements for the past 2 months: Single Family Home   Patient language and need for interpreter reviewed:: Yes        Need for Family Participation in Patient Care: Yes (Comment) Care giver support system in place?: Yes (comment)   Criminal Activity/Legal Involvement Pertinent to Current Situation/Hospitalization: No - Comment as needed  Activities of Daily Living Home Assistive Devices/Equipment: None ADL Screening (condition at time of admission) Patient's cognitive ability adequate to safely complete daily activities?: Yes Is the patient deaf or have difficulty hearing?: No Does the patient have difficulty seeing, even when wearing glasses/contacts?: No Does the patient have difficulty concentrating, remembering, or making decisions?: No Patient able to express need for assistance with ADLs?: Yes Does the patient have difficulty dressing or bathing?:  No Independently performs ADLs?: Yes (appropriate for developmental age) Does the patient have difficulty walking or climbing stairs?: No Weakness of Legs: None Weakness of Arms/Hands: None  Permission Sought/Granted                  Emotional Assessment     Affect (typically observed): Appropriate Orientation: : Oriented to Self,Oriented to Place,Oriented to  Time,Oriented to Situation Alcohol / Substance Use: Not Applicable Psych Involvement: No (comment)  Admission diagnosis:  Hyponatremia [E87.1] Hypoxia [R09.02] Acute renal failure, unspecified acute renal failure type (HCC) [N17.9] COVID [U07.1] COVID-19 virus infection [U07.1] Acute respiratory disease due to COVID-19 virus [U07.1, J06.9] Patient Active Problem List   Diagnosis Date Noted  . COVID-19 virus infection 05/22/2020  . Strabismus   . Hyperlipidemia   . Hypertension    PCP:  Donita Brooks, MD Pharmacy:   [facility] [phone] - [place], [place] - [address] AT [facility] [address] [place] [address] Phone: [phone] Fax: [phone]     Social Determinants of Health (SDOH) Interventions    Readmission Risk Interventions No flowsheet data found.

## 2020-05-26 NOTE — Progress Notes (Signed)
SATURATION QUALIFICATIONS: (This note is used to comply with regulatory documentation for home oxygen)  Patient Saturations on Room Air at Rest = 90%  Patient Saturations on Room Air while Ambulating = 83%  Patient Saturations on 4 Liters of oxygen while Ambulating = 91%  Please briefly explain why patient needs home oxygen: to maintain O2 sats greater than 90% while ambulating.  Manya Silvas, RN

## 2020-05-26 NOTE — Care Management Important Message (Signed)
Important Message  Patient Details  Name: STRUMMER CANIPE MRN: [id number] Date of Birth: 11/23/1955   Medicare Important Message Given:  Yes - Important Message mailed due to current National Emergency     Corey Harold 05/26/2020, 11:11 AM

## 2020-05-26 NOTE — Plan of Care (Signed)
  Problem: Education: Goal: Knowledge of risk factors and measures for prevention of condition will improve Outcome: Progressing   Problem: Coping: Goal: Psychosocial and spiritual needs will be supported Outcome: Progressing   Problem: Respiratory: Goal: Will maintain a patent airway Outcome: Progressing Goal: Complications related to the disease process, condition or treatment will be avoided or minimized Outcome: Progressing   Problem: Education: Goal: Knowledge of General Education information will improve Description: Including pain rating scale, medication(s)/side effects and non-pharmacologic comfort measures Outcome: Progressing   Problem: Health Behavior/Discharge Planning: Goal: Ability to manage health-related needs will improve Outcome: Progressing   Problem: Clinical Measurements: Goal: Ability to maintain clinical measurements within normal limits will improve Outcome: Progressing Goal: Respiratory complications will improve Outcome: Progressing   Problem: Activity: Goal: Risk for activity intolerance will decrease Outcome: Progressing   Problem: Nutrition: Goal: Adequate nutrition will be maintained Outcome: Progressing   

## 2020-05-27 LAB — CULTURE, BLOOD (ROUTINE X 2)
Culture: NO GROWTH
Culture: NO GROWTH
Special Requests: ADEQUATE
Special Requests: ADEQUATE

## 2020-05-30 ENCOUNTER — Telehealth: Payer: Self-pay

## 2020-05-30 NOTE — Telephone Encounter (Signed)
Please schedule hosp disc follow up for tomorrrow and we can discuss.

## 2020-05-30 NOTE — Telephone Encounter (Signed)
Patient tested positive for covid, his wife called in concerns of having a taper for prednisone. Should he need to have one

## 2020-05-30 NOTE — Telephone Encounter (Signed)
Wife called in concern of Daniel Rios, does he need a taper with taking his prednisone?

## 2020-05-30 NOTE — Telephone Encounter (Signed)
Patient has appointment scheduled for 05-31-20 @ 11am

## 2020-05-31 ENCOUNTER — Telehealth (INDEPENDENT_AMBULATORY_CARE_PROVIDER_SITE_OTHER): Payer: PPO | Admitting: Family Medicine

## 2020-05-31 ENCOUNTER — Other Ambulatory Visit: Payer: Self-pay

## 2020-05-31 DIAGNOSIS — U071 COVID-19: Secondary | ICD-10-CM

## 2020-05-31 DIAGNOSIS — R7989 Other specified abnormal findings of blood chemistry: Secondary | ICD-10-CM | POA: Diagnosis not present

## 2020-05-31 NOTE — Progress Notes (Signed)
Subjective:    Patient ID: Daniel Rios, male    DOB: 02-01-1956, 65 y.o.   MRN: [id number]  HPI Patient is being seen today as a telephone visit.  Patient consents to be seen via telephone.  He is currently at home.  I am currently in my office.  Phone call began at 1050.  Phone call concluded at 1110.  Patient was diagnosed with COVID on December 24.  Unfortunately his condition worsen and he was admitted to the hospital with hypoxia and hypotension on January 2.  He remained hospitalized from January 2 through January 6.  On admission to the hospital he was found to have bilateral airspace disease on chest x-ray.  He also had metabolic acidosis with a bicarb level of 21.  He had acute renal insufficiency due to prerenal azotemia with creatinine of 1.62.  He also had mild elevations in his liver function tests with an AST of 84 and an ALT of 54.  He was treated with 5 days of IV remdesivir.  He was also started on IV steroids.  He has just completed his last day of prednisone 50 mg a day today.  He still on 4 to 5 L of oxygen with his oxygen saturations in the low 90s.  However if he stands up and tries to exercise or do any physical activity his oxygen level quickly dropped below 90.  Therefore he is not ready to start weaning off his oxygen yet.  On discharge from the hospital, his creatinine had improved to 1.10.  Bicarbonate was normal at 26.  AST and ALT remain slightly elevated at 63 and 62 respectively.  Glucose was elevated at 143 but he was on steroids at the time.  He denies any leg swelling.  He denies any pleurisy or hemoptysis.  He still coughing quite a bit.  He denies any wheezing or fever or chills.  He still has fatigue but he is gradually improving and he states that he is starting to feel back to normal Past Medical History:  Diagnosis Date  . Allergy   . Hyperlipidemia   . Hypertension   . Strabismus    Past Surgical History:  Procedure Laterality Date  . COLONOSCOPY   11-01-2003   normal  . COLONOSCOPY  06/21/2014   1-TA  . FOOT SURGERY Right 2019   Current Outpatient Medications on File Prior to Visit  Medication Sig Dispense Refill  . amLODipine (NORVASC) 5 MG tablet TAKE 1 TABLET(5 MG) BY MOUTH DAILY (Patient taking differently: Take 5 mg by mouth daily.) 90 tablet 3  . aspirin 81 MG tablet Take 81 mg by mouth daily.    . benzonatate (TESSALON) 100 MG capsule Take 1 capsule (100 mg total) by mouth 3 (three) times daily. 20 capsule 0  . chlorpheniramine-HYDROcodone (TUSSIONEX) 10-8 MG/5ML SUER Take 5 mLs by mouth every 12 (twelve) hours as needed for cough. 140 mL 0  . fluticasone (FLONASE) 50 MCG/ACT nasal spray Place 2 sprays into both nostrils daily. 16 g 6  . levocetirizine (XYZAL) 5 MG tablet Take 1 tablet (5 mg total) by mouth every evening. 30 tablet 5  . predniSONE (DELTASONE) 50 MG tablet Take 1 tablet (50 mg total) by mouth daily. 6 tablet 0  . rosuvastatin (CRESTOR) 20 MG tablet TAKE 1 TABLET(20 MG) BY MOUTH DAILY (Patient taking differently: Take 20 mg by mouth daily.) 90 tablet 0  . tamsulosin (FLOMAX) 0.4 MG CAPS capsule TAKE 1 CAPSULE BY MOUTH EVERY  NIGHT AT BEDTIME (Patient taking differently: Take 0.4 mg by mouth at bedtime.) 90 capsule 3  . albuterol (VENTOLIN HFA) 108 (90 Base) MCG/ACT inhaler Inhale 1 puff into the lungs every 6 (six) hours as needed for wheezing or shortness of breath. 18 g 0  . Multiple Vitamin (MULTIVITAMIN) tablet Take 1 tablet by mouth daily. One a day 50+     Current Facility-Administered Medications on File Prior to Visit  Medication Dose Route Frequency Provider Last Rate Last Admin  . 0.9 %  sodium chloride infusion  500 mL Intravenous Continuous Ladene Artist, MD       Allergies  Allergen Reactions  . Celebrex [Celecoxib] Swelling and Rash   Social History   Socioeconomic History  . Marital status: Married    Spouse name: Not on file  . Number of children: Not on file  . Years of education: Not  on file  . Highest education level: Not on file  Occupational History  . Not on file  Tobacco Use  . Smoking status: Former Smoker    Years: 2.00    Types: Cigarettes    Quit date: 09/22/1982    Years since quitting: 37.7  . Smokeless tobacco: Never Used  Vaping Use  . Vaping Use: Never used  Substance and Sexual Activity  . Alcohol use: Yes    Alcohol/week: 7.0 standard drinks    Types: 7 Cans of beer per week  . Drug use: No  . Sexual activity: Not on file    Comment: married, quit smoking in 1982.    Other Topics Concern  . Not on file  Social History Narrative  . Not on file   Social Determinants of Health   Financial Resource Strain: Not on file  Food Insecurity: Not on file  Transportation Needs: Not on file  Physical Activity: Not on file  Stress: Not on file  Social Connections: Not on file  Intimate Partner Violence: Not on file      Review of Systems  All other systems reviewed and are negative.      Objective:   Physical Exam        Assessment & Plan:  [id number] virus infection - Plan: COMPLETE METABOLIC PANEL WITH GFR, DG Chest 2 View  Elevated LFTs - Plan: COMPLETE METABOLIC PANEL WITH GFR  Blood pressure is 130/86 today at home.  He is afebrile.  His oxygen saturations above 90% on 4 L of oxygen.  However he is not yet ready to start weaning down on the oxygen.  I recommended that gradually over the next 1 to 2 weeks he gradually try to wean back on the oxygen however I encouraged him to titrate the oxygen to achieve an oxygen saturation greater than 90%.  Continue amlodipine as his blood pressure is good today however of asked him to hold Crestor.  I want him to come in next week so that we can check a CMP.  Once his liver function test have normalized, he can resume Crestor.  Repeat a chest x-ray at the beginning of February to ensure resolution of the bilateral airspace disease.  Discontinue prednisone today.  Recommended a flu shot next week when  he comes in for lab work.  Recommended a COVID booster in March

## 2020-06-08 ENCOUNTER — Encounter: Payer: Self-pay | Admitting: Family Medicine

## 2020-06-09 ENCOUNTER — Other Ambulatory Visit: Payer: Self-pay | Admitting: Family Medicine

## 2020-06-09 MED ORDER — AMOXICILLIN 875 MG PO TABS: 875.0000 mg | ORAL_TABLET | Freq: Two times a day (BID) | ORAL | 0 refills | Status: DC

## 2020-06-10 ENCOUNTER — Other Ambulatory Visit: Payer: Self-pay | Admitting: Family Medicine

## 2020-06-10 MED ORDER — HYDROCOD POLST-CPM POLST ER 10-8 MG/5ML PO SUER: 5.0000 mL | Freq: Two times a day (BID) | ORAL | 0 refills | Status: DC | PRN

## 2020-06-10 NOTE — Telephone Encounter (Signed)
Ok to refill?  Patient having increased cough/ congestion from COVID.

## 2020-06-10 NOTE — Telephone Encounter (Addendum)
Pt. Wife callled needing refill ofchlorpheniramine-HYDROcodone ([place]) 10-8 MG/5ML SUER  cb [phone]

## 2020-06-10 NOTE — Telephone Encounter (Deleted)
cb [phone]

## 2020-06-13 ENCOUNTER — Other Ambulatory Visit: Payer: PPO

## 2020-06-13 ENCOUNTER — Other Ambulatory Visit: Payer: Self-pay

## 2020-06-13 DIAGNOSIS — R7989 Other specified abnormal findings of blood chemistry: Secondary | ICD-10-CM | POA: Diagnosis not present

## 2020-06-13 DIAGNOSIS — U071 COVID-19: Secondary | ICD-10-CM | POA: Diagnosis not present

## 2020-06-14 LAB — COMPLETE METABOLIC PANEL WITH GFR
AG Ratio: 0.9 (calc) — ABNORMAL LOW (ref 1.0–2.5)
ALT: 45 U/L (ref 9–46)
AST: 29 U/L (ref 10–35)
Albumin: 3 g/dL — ABNORMAL LOW (ref 3.6–5.1)
Alkaline phosphatase (APISO): 103 U/L (ref 35–144)
BUN: 13 mg/dL (ref 7–25)
CO2: 26 mmol/L (ref 20–32)
Calcium: 9.1 mg/dL (ref 8.6–10.3)
Chloride: 100 mmol/L (ref 98–110)
Creat: 1.17 mg/dL (ref 0.70–1.25)
GFR, Est African American: 75 mL/min/{1.73_m2} (ref >=60)
GFR, Est Non African American: 65 mL/min/{1.73_m2} (ref >=60)
Globulin: 3.5 g/dL (calc) (ref 1.9–3.7)
Glucose, Bld: 142 mg/dL — ABNORMAL HIGH (ref 65–99)
Potassium: 5.3 mmol/L (ref 3.5–5.3)
Sodium: 136 mmol/L (ref 135–146)
Total Bilirubin: 0.5 mg/dL (ref 0.2–1.2)
Total Protein: 6.5 g/dL (ref 6.1–8.1)

## 2020-06-16 ENCOUNTER — Other Ambulatory Visit: Payer: Self-pay | Admitting: Family Medicine

## 2020-06-16 ENCOUNTER — Encounter: Payer: Self-pay | Admitting: Family Medicine

## 2020-06-21 ENCOUNTER — Ambulatory Visit
Admission: RE | Admit: 2020-06-21 | Discharge: 2020-06-21 | Disposition: A | Discharge status: Home / Self Care | Payer: PPO | Source: Ambulatory Visit | Attending: Family Medicine | Admitting: Family Medicine

## 2020-06-21 DIAGNOSIS — R059 Cough, unspecified: Secondary | ICD-10-CM | POA: Diagnosis not present

## 2020-06-21 DIAGNOSIS — R0602 Shortness of breath: Secondary | ICD-10-CM | POA: Diagnosis not present

## 2020-06-21 DIAGNOSIS — U071 COVID-19: Secondary | ICD-10-CM

## 2020-06-23 ENCOUNTER — Other Ambulatory Visit: Payer: Self-pay | Admitting: Family Medicine

## 2020-06-23 DIAGNOSIS — I10 Essential (primary) hypertension: Secondary | ICD-10-CM

## 2020-06-23 MED ORDER — LEVOFLOXACIN 500 MG PO TABS: 500.0000 mg | ORAL_TABLET | Freq: Every day | ORAL | 0 refills | Status: DC

## 2020-06-27 ENCOUNTER — Other Ambulatory Visit: Payer: Self-pay | Admitting: Family Medicine

## 2020-06-27 DIAGNOSIS — E7800 Pure hypercholesterolemia, unspecified: Secondary | ICD-10-CM

## 2020-06-30 ENCOUNTER — Encounter: Payer: Self-pay | Admitting: Family Medicine

## 2020-06-30 ENCOUNTER — Other Ambulatory Visit: Payer: Self-pay

## 2020-06-30 ENCOUNTER — Ambulatory Visit (INDEPENDENT_AMBULATORY_CARE_PROVIDER_SITE_OTHER): Payer: PPO | Admitting: Family Medicine

## 2020-06-30 VITALS — BP 122/66 | HR 100 | Temp 99.2°F | Resp 18 | Ht 72.0 in | Wt 187.0 lb

## 2020-06-30 DIAGNOSIS — U071 COVID-19: Secondary | ICD-10-CM

## 2020-06-30 MED ORDER — PREDNISONE 20 MG PO TABS: 40.0000 mg | ORAL_TABLET | Freq: Every day | ORAL | 0 refills | Status: DC

## 2020-06-30 NOTE — Progress Notes (Signed)
Subjective:    Patient ID: Daniel Rios, male    DOB: 1956-03-09, 65 y.o.   MRN: [id number]  HPI Patient was diagnosed with COVID on December 24.  Unfortunately his condition worsen and he was admitted to the hospital with hypoxia and hypotension on January 2.  He remained hospitalized from January 2 through January 6.  On admission to the hospital he was found to have bilateral airspace disease on chest x-ray.  He also had metabolic acidosis with a bicarb level of 21.  He had acute renal insufficiency due to prerenal azotemia with creatinine of 1.62.  He also had mild elevations in his liver function tests with an AST of 84 and an ALT of 54.  He was treated with 5 days of IV remdesivir.  He was also started on IV steroids.    06/30/20 Patient states that he acutely worsened after leaving the hospital.  I obtained a chest x-ray on February 1 which showed patchy multifocal pneumonia with increasing densities compared to previous.  Therefore I was concerned about an secondary bacterial pneumonia on top of our tissue from his recent COVID-19.  We started the patient on Levaquin.  He states that he is finished the Levaquin and he starting to feel some better however by no means is he back to normal.  Although his oxygen saturations are within normal limits today, the patient is winded simply talking to me.  He frequently has to stop and catch his breath just to have a conversation even sitting in a chair.  This is by no means normal for this patient.  He also said recently he developed a sharp pain near the sternum to the left of midline.  This was near the xiphoid process.  It occurred yesterday and heard him through the night.  It somewhat better this morning however it still present.  He continues to cough frequently throughout our exam.  On exam, the patient has profound bibasilar crackles which are dry all the way up to the midlung fields bilaterally.  This would match with what was seen on the chest  x-ray.  There is no pitting edema in his extremities.  There is no tachycardia. Past Medical History:  Diagnosis Date  . Allergy   . Hyperlipidemia   . Hypertension   . Strabismus    Past Surgical History:  Procedure Laterality Date  . COLONOSCOPY  11-01-2003   normal  . COLONOSCOPY  06/21/2014   1-TA  . FOOT SURGERY Right 2019   Current Outpatient Medications on File Prior to Visit  Medication Sig Dispense Refill  . albuterol (VENTOLIN HFA) 108 (90 Base) MCG/ACT inhaler Inhale 1 puff into the lungs every 6 (six) hours as needed for wheezing or shortness of breath. 18 g 0  . amLODipine (NORVASC) 5 MG tablet TAKE 1 TABLET(5 MG) BY MOUTH DAILY (Patient taking differently: Take 5 mg by mouth daily.) 90 tablet 3  . aspirin 81 MG tablet Take 81 mg by mouth daily.    . fluticasone (FLONASE) 50 MCG/ACT nasal spray Place 2 sprays into both nostrils daily. 16 g 6  . levocetirizine (XYZAL) 5 MG tablet Take 1 tablet (5 mg total) by mouth every evening. 30 tablet 5  . Multiple Vitamin (MULTIVITAMIN) tablet Take 1 tablet by mouth daily. One a day 50+    . tamsulosin (FLOMAX) 0.4 MG CAPS capsule TAKE 1 CAPSULE BY MOUTH EVERY NIGHT AT BEDTIME (Patient taking differently: Take 0.4 mg by mouth at bedtime.)  90 capsule 3  . rosuvastatin (CRESTOR) 20 MG tablet TAKE 1 TABLET(20 MG) BY MOUTH DAILY (Patient not taking: Reported on 06/30/2020) 90 tablet 0   Current Facility-Administered Medications on File Prior to Visit  Medication Dose Route Frequency Provider Last Rate Last Admin  . 0.9 %  sodium chloride infusion  500 mL Intravenous Continuous Ladene Artist, MD       Allergies  Allergen Reactions  . Celebrex [Celecoxib] Swelling and Rash   Social History   Socioeconomic History  . Marital status: Married    Spouse name: Not on file  . Number of children: Not on file  . Years of education: Not on file  . Highest education level: Not on file  Occupational History  . Not on file  Tobacco Use   . Smoking status: Former Smoker    Years: 2.00    Types: Cigarettes    Quit date: 09/22/1982    Years since quitting: 37.7  . Smokeless tobacco: Never Used  Vaping Use  . Vaping Use: Never used  Substance and Sexual Activity  . Alcohol use: Yes    Alcohol/week: 7.0 standard drinks    Types: 7 Cans of beer per week  . Drug use: No  . Sexual activity: Not on file    Comment: married, quit smoking in 1982.    Other Topics Concern  . Not on file  Social History Narrative  . Not on file   Social Determinants of Health   Financial Resource Strain: Not on file  Food Insecurity: Not on file  Transportation Needs: Not on file  Physical Activity: Not on file  Stress: Not on file  Social Connections: Not on file  Intimate Partner Violence: Not on file      Review of Systems  All other systems reviewed and are negative.      Objective:   Physical Exam Constitutional:      Appearance: Normal appearance. He is normal weight.  Cardiovascular:     Rate and Rhythm: Normal rate and regular rhythm.     Heart sounds: Normal heart sounds.  Pulmonary:     Effort: Tachypnea present. No respiratory distress.     Breath sounds: Decreased air movement present. No stridor. Rhonchi and rales present. No wheezing.    Chest:     Chest wall: No tenderness.  Musculoskeletal:     Right lower leg: No edema.     Left lower leg: No edema.  Neurological:     Mental Status: He is alert.           Assessment & Plan:  COVID-19 - Plan: CBC with Differential/Platelet, COMPLETE METABOLIC PANEL WITH GFR, D-dimer, quantitative (not at [facility])  Patient appears to have scar tissue related to his recent battle with COVID.  I believe he acquired a secondary pneumonia that we have treated with Levaquin.  Today on his exam, I am hearing crackles bilaterally consistent with fibrosis and scar tissue.  Therefore I will start the patient on prednisone 40 mg a day for the next 7 days and see the patient  back next week to reassess.  I feel the majority of his shortness of breath is related to scar tissue in the lungs.  I do believe that he has improved subjectively from pneumonia after taking the Levaquin.  However also concerned given his recent "exacerbation" about a possible pulmonary embolism.  Therefore I will check a D-dimer and if positive we will schedule the patient for CT scan  of the chest to rule out PE

## 2020-07-01 ENCOUNTER — Other Ambulatory Visit: Payer: Self-pay | Admitting: Family Medicine

## 2020-07-01 ENCOUNTER — Ambulatory Visit
Admission: RE | Admit: 2020-07-01 | Discharge: 2020-07-01 | Disposition: A | Discharge status: Home / Self Care | Payer: PPO | Source: Ambulatory Visit | Attending: Family Medicine | Admitting: Family Medicine

## 2020-07-01 DIAGNOSIS — R918 Other nonspecific abnormal finding of lung field: Secondary | ICD-10-CM | POA: Diagnosis not present

## 2020-07-01 DIAGNOSIS — R071 Chest pain on breathing: Secondary | ICD-10-CM

## 2020-07-01 DIAGNOSIS — R0602 Shortness of breath: Secondary | ICD-10-CM

## 2020-07-01 DIAGNOSIS — J90 Pleural effusion, not elsewhere classified: Secondary | ICD-10-CM | POA: Diagnosis not present

## 2020-07-01 DIAGNOSIS — R7989 Other specified abnormal findings of blood chemistry: Secondary | ICD-10-CM

## 2020-07-01 LAB — COMPLETE METABOLIC PANEL WITH GFR
AG Ratio: 1 (calc) (ref 1.0–2.5)
ALT: 26 U/L (ref 9–46)
AST: 25 U/L (ref 10–35)
Albumin: 3.8 g/dL (ref 3.6–5.1)
Alkaline phosphatase (APISO): 99 U/L (ref 35–144)
BUN: 14 mg/dL (ref 7–25)
CO2: 28 mmol/L (ref 20–32)
Calcium: 10 mg/dL (ref 8.6–10.3)
Chloride: 102 mmol/L (ref 98–110)
Creat: 1.18 mg/dL (ref 0.70–1.25)
GFR, Est African American: 74 mL/min/{1.73_m2} (ref >=60)
GFR, Est Non African American: 64 mL/min/{1.73_m2} (ref >=60)
Globulin: 3.7 g/dL (calc) (ref 1.9–3.7)
Glucose, Bld: 89 mg/dL (ref 65–99)
Potassium: 5.2 mmol/L (ref 3.5–5.3)
Sodium: 139 mmol/L (ref 135–146)
Total Bilirubin: 0.5 mg/dL (ref 0.2–1.2)
Total Protein: 7.5 g/dL (ref 6.1–8.1)

## 2020-07-01 LAB — CBC WITH DIFFERENTIAL/PLATELET
Absolute Monocytes: 1035 cells/uL — ABNORMAL HIGH (ref 200–950)
Basophils Absolute: 196 cells/uL (ref 0–200)
Basophils Relative: 1.7 %
Eosinophils Absolute: 288 cells/uL (ref 15–500)
Eosinophils Relative: 2.5 %
HCT: 43.8 % (ref 38.5–50.0)
Hemoglobin: 14.6 g/dL (ref 13.2–17.1)
Lymphs Abs: 2404 cells/uL (ref 850–3900)
MCH: 31.3 pg (ref 27.0–33.0)
MCHC: 33.3 g/dL (ref 32.0–36.0)
MCV: 94 fL (ref 80.0–100.0)
MPV: 8.8 fL (ref 7.5–12.5)
Monocytes Relative: 9 %
Neutro Abs: 7579 cells/uL (ref 1500–7800)
Neutrophils Relative %: 65.9 %
Platelets: 537 10*3/uL — ABNORMAL HIGH (ref 140–400)
RBC: 4.66 10*6/uL (ref 4.20–5.80)
RDW: 12.6 % (ref 11.0–15.0)
Total Lymphocyte: 20.9 %
WBC: 11.5 10*3/uL — ABNORMAL HIGH (ref 3.8–10.8)

## 2020-07-01 LAB — D-DIMER, QUANTITATIVE: D-Dimer, Quant: 2.41 mcg/mL FEU — ABNORMAL HIGH (ref <0.50)

## 2020-07-01 MED ORDER — AZITHROMYCIN 250 MG PO TABS: ORAL_TABLET | ORAL | 0 refills | Status: DC

## 2020-07-01 MED ORDER — IOPAMIDOL (ISOVUE-370) INJECTION 76%
75.0000 mL | Freq: Once | INTRAVENOUS | Status: AC | PRN
  Administered 2020-07-01: 75 mL via INTRAVENOUS

## 2020-07-07 ENCOUNTER — Other Ambulatory Visit: Payer: Self-pay

## 2020-07-07 ENCOUNTER — Ambulatory Visit (INDEPENDENT_AMBULATORY_CARE_PROVIDER_SITE_OTHER): Payer: PPO | Admitting: Family Medicine

## 2020-07-07 VITALS — BP 142/80 | HR 97 | Temp 98.0°F | Wt 191.0 lb

## 2020-07-07 DIAGNOSIS — R7989 Other specified abnormal findings of blood chemistry: Secondary | ICD-10-CM | POA: Diagnosis not present

## 2020-07-07 DIAGNOSIS — R071 Chest pain on breathing: Secondary | ICD-10-CM

## 2020-07-07 DIAGNOSIS — U071 COVID-19: Secondary | ICD-10-CM | POA: Diagnosis not present

## 2020-07-07 DIAGNOSIS — R0602 Shortness of breath: Secondary | ICD-10-CM

## 2020-07-07 NOTE — Progress Notes (Signed)
Subjective:    Patient ID: Daniel Rios, male    DOB: Dec 10, 1955, 65 y.o.   MRN: [id number]  HPI Patient was diagnosed with COVID on December 24.  Unfortunately his condition worsen and he was admitted to the hospital with hypoxia and hypotension on January 2.  He remained hospitalized from January 2 through January 6.  On admission to the hospital he was found to have bilateral airspace disease on chest x-ray.  He also had metabolic acidosis with a bicarb level of 21.  He had acute renal insufficiency due to prerenal azotemia with creatinine of 1.62.  He also had mild elevations in his liver function tests with an AST of 84 and an ALT of 54.  He was treated with 5 days of IV remdesivir.  He was also started on IV steroids.    06/30/20 Patient states that he acutely worsened after leaving the hospital.  I obtained a chest x-ray on February 1 which showed patchy multifocal pneumonia with increasing densities compared to previous.  Therefore I was concerned about an secondary bacterial pneumonia on top of our tissue from his recent COVID-19.  We started the patient on Levaquin.  He states that he is finished the Levaquin and he starting to feel some better however by no means is he back to normal.  Although his oxygen saturations are within normal limits today, the patient is winded simply talking to me.  He frequently has to stop and catch his breath just to have a conversation even sitting in a chair.  This is by no means normal for this patient.  He also said recently he developed a sharp pain near the sternum to the left of midline.  This was near the xiphoid process.  It occurred yesterday and heard him through the night.  It somewhat better this morning however it still present.  He continues to cough frequently throughout our exam.  On exam, the patient has profound bibasilar crackles which are dry all the way up to the midlung fields bilaterally.  This would match with what was seen on the chest  x-ray.  There is no pitting edema in his extremities.  There is no tachycardia.  At that time, my plan was: Patient appears to have scar tissue related to his recent battle with COVID.  I believe he acquired a secondary pneumonia that we have treated with Levaquin.  Today on his exam, I am hearing crackles bilaterally consistent with fibrosis and scar tissue.  Therefore I will start the patient on prednisone 40 mg a day for the next 7 days and see the patient back next week to reassess.  I feel the majority of his shortness of breath is related to scar tissue in the lungs.  I do believe that he has improved subjectively from pneumonia after taking the Levaquin.  However also concerned given his recent "exacerbation" about a possible pulmonary embolism.  Therefore I will check a D-dimer and if positive we will schedule the patient for CT scan of the chest to rule out PE  07/07/20 D-dimer was significantly elevated more so than when he was discharged from the hospital up from 0.6 to greater than 2. Therefore I was concerned about a pulmonary embolism. Patient had a CT scan which ruled out PE but did show multifocal airspace opacities consistent with multifocal pneumonia. I did add a Z-Pak in addition to his Levaquin for possible atypical infections and also started the patient on prednisone 40 mg a  day for 7 days. He is doing much better now. He states he is probably 70% better compared to last week. His breathing is less labored. His lungs sound virtually clear today aside from a few scattered crackles but considerably better than his last visit Past Medical History:  Diagnosis Date  . Allergy   . Hyperlipidemia   . Hypertension   . Strabismus    Past Surgical History:  Procedure Laterality Date  . COLONOSCOPY  11-01-2003   normal  . COLONOSCOPY  06/21/2014   1-TA  . FOOT SURGERY Right 2019   Current Outpatient Medications on File Prior to Visit  Medication Sig Dispense Refill  . amLODipine  (NORVASC) 5 MG tablet TAKE 1 TABLET(5 MG) BY MOUTH DAILY (Patient taking differently: Take 5 mg by mouth daily.) 90 tablet 3  . aspirin 81 MG tablet Take 81 mg by mouth daily.    . fluticasone (FLONASE) 50 MCG/ACT nasal spray Place 2 sprays into both nostrils daily. 16 g 6  . levocetirizine (XYZAL) 5 MG tablet Take 1 tablet (5 mg total) by mouth every evening. 30 tablet 5  . rosuvastatin (CRESTOR) 20 MG tablet TAKE 1 TABLET(20 MG) BY MOUTH DAILY 90 tablet 0  . tamsulosin (FLOMAX) 0.4 MG CAPS capsule TAKE 1 CAPSULE BY MOUTH EVERY NIGHT AT BEDTIME (Patient taking differently: Take 0.4 mg by mouth at bedtime.) 90 capsule 3  . albuterol (VENTOLIN HFA) 108 (90 Base) MCG/ACT inhaler Inhale 1 puff into the lungs every 6 (six) hours as needed for wheezing or shortness of breath. 18 g 0  . Multiple Vitamin (MULTIVITAMIN) tablet Take 1 tablet by mouth daily. One a day 50+    . predniSONE (DELTASONE) 20 MG tablet Take 2 tablets (40 mg total) by mouth daily with breakfast. (Patient not taking: Reported on 07/07/2020) 14 tablet 0   Current Facility-Administered Medications on File Prior to Visit  Medication Dose Route Frequency Provider Last Rate Last Admin  . 0.9 %  sodium chloride infusion  500 mL Intravenous Continuous Ladene Artist, MD       Allergies  Allergen Reactions  . Celebrex [Celecoxib] Swelling and Rash   Social History   Socioeconomic History  . Marital status: Married    Spouse name: Not on file  . Number of children: Not on file  . Years of education: Not on file  . Highest education level: Not on file  Occupational History  . Not on file  Tobacco Use  . Smoking status: Former Smoker    Years: 2.00    Types: Cigarettes    Quit date: 09/22/1982    Years since quitting: 37.8  . Smokeless tobacco: Never Used  Vaping Use  . Vaping Use: Never used  Substance and Sexual Activity  . Alcohol use: Yes    Alcohol/week: 7.0 standard drinks    Types: 7 Cans of beer per week  . Drug  use: No  . Sexual activity: Not on file    Comment: married, quit smoking in 1982.    Other Topics Concern  . Not on file  Social History Narrative  . Not on file   Social Determinants of Health   Financial Resource Strain: Not on file  Food Insecurity: Not on file  Transportation Needs: Not on file  Physical Activity: Not on file  Stress: Not on file  Social Connections: Not on file  Intimate Partner Violence: Not on file      Review of Systems  All other systems  reviewed and are negative.      Objective:   Physical Exam Constitutional:      Appearance: Normal appearance. He is normal weight.  Cardiovascular:     Rate and Rhythm: Normal rate and regular rhythm.     Heart sounds: Normal heart sounds.  Pulmonary:     Effort: No tachypnea or respiratory distress.     Breath sounds: No stridor or decreased air movement. Rales present. No wheezing or rhonchi.  Chest:     Chest wall: No tenderness.  Musculoskeletal:     Right lower leg: No edema.     Left lower leg: No edema.  Neurological:     Mental Status: He is alert.           Assessment & Plan:  COVID-19  SOB (shortness of breath)  Chest pain on breathing  Positive D dimer  Given the fact the patient's D-dimer was significantly elevated, I believe that that proves that there was an acute process going on after the COVID either a pulmonary embolism or a multifocal pneumonia and not just simply scarring from the Covid. Given the fact he is improved dramatically after taking Levaquin and Z-Pak, and the CT scan was negative for PE, I believe that that proves it was multifocal pneumonia. I have asked the patient to stop prednisone. We will only wean the patient off prednisone if his breathing worsens after he stops the prednisone but his lung exam today is much better and I do not see an indication to keep him on continuous steroid therapy.

## 2020-07-08 ENCOUNTER — Telehealth: Payer: Self-pay | Admitting: Family Medicine

## 2020-07-08 NOTE — Telephone Encounter (Signed)
Order faxed to [facility]

## 2020-07-08 NOTE — Telephone Encounter (Signed)
Patient had F/U appointment on 07/07/2020.  Ok to send order to D/C?

## 2020-07-08 NOTE — Telephone Encounter (Signed)
Calling for Dr.Pickard to stop his Oxygen rx [facility] # [phone]

## 2020-07-08 NOTE — Telephone Encounter (Signed)
ok 

## 2020-07-13 ENCOUNTER — Telehealth: Payer: Self-pay | Admitting: Family Medicine

## 2020-07-13 NOTE — Telephone Encounter (Signed)
Patient reports that [facility] has no received prior order.   New order awaiting signature on desk.

## 2020-07-13 NOTE — Telephone Encounter (Signed)
Need for Dr.Pickard to cancel his oxygen from [facility] so they need to pick up equipment

## 2020-07-21 ENCOUNTER — Telehealth: Payer: Self-pay

## 2020-07-21 NOTE — Telephone Encounter (Signed)
Call placed to [facility].   Reports that they have not received either order that has been faxed.   \verified fax number and re-sent.

## 2020-07-21 NOTE — Telephone Encounter (Signed)
Spoke with pt, informed that order was sent and confirmed received to [facility] in order for pt to no longer receive o2 supplies

## 2020-07-29 ENCOUNTER — Encounter: Payer: Self-pay | Admitting: Family Medicine

## 2020-07-29 MED ORDER — BUDESONIDE-FORMOTEROL FUMARATE 160-4.5 MCG/ACT IN AERO: 2.0000 | INHALATION_SPRAY | Freq: Two times a day (BID) | RESPIRATORY_TRACT | 3 refills | Status: DC

## 2020-09-06 ENCOUNTER — Other Ambulatory Visit: Payer: Self-pay

## 2020-09-06 ENCOUNTER — Telehealth: Payer: Self-pay | Admitting: *Deleted

## 2020-09-06 ENCOUNTER — Ambulatory Visit (INDEPENDENT_AMBULATORY_CARE_PROVIDER_SITE_OTHER): Payer: PPO | Admitting: Nurse Practitioner

## 2020-09-06 VITALS — BP 148/98 | HR 88 | Temp 98.2°F | Ht 72.0 in | Wt 196.2 lb

## 2020-09-06 DIAGNOSIS — I10 Essential (primary) hypertension: Secondary | ICD-10-CM

## 2020-09-06 DIAGNOSIS — J329 Chronic sinusitis, unspecified: Secondary | ICD-10-CM | POA: Diagnosis not present

## 2020-09-06 DIAGNOSIS — B9689 Other specified bacterial agents as the cause of diseases classified elsewhere: Secondary | ICD-10-CM | POA: Diagnosis not present

## 2020-09-06 MED ORDER — AMOXICILLIN-POT CLAVULANATE ER 1000-62.5 MG PO TB12: 2.0000 | ORAL_TABLET | Freq: Two times a day (BID) | ORAL | 0 refills | Status: DC

## 2020-09-06 MED ORDER — LEVOFLOXACIN 750 MG PO TABS: 750.0000 mg | ORAL_TABLET | Freq: Every day | ORAL | 0 refills | Status: DC

## 2020-09-06 MED ORDER — PREDNISONE 20 MG PO TABS: 40.0000 mg | ORAL_TABLET | Freq: Every day | ORAL | 0 refills | Status: DC

## 2020-09-06 MED ORDER — GUAIFENESIN ER 600 MG PO TB12: 600.0000 mg | ORAL_TABLET | Freq: Two times a day (BID) | ORAL | 0 refills | Status: DC | PRN

## 2020-09-06 MED ORDER — MOXIFLOXACIN HCL 400 MG PO TABS: 400.0000 mg | ORAL_TABLET | Freq: Every day | ORAL | 0 refills | Status: DC

## 2020-09-06 NOTE — Patient Instructions (Signed)

## 2020-09-06 NOTE — Telephone Encounter (Signed)
Call placed to pharmacy and prior prescription cancelled.   Avelox also noted rather expensive at $75.  Please advise.

## 2020-09-06 NOTE — Telephone Encounter (Signed)
Received call from patient.   Reports that high dose Augmentin (1000/62.5mg ) is not covered by insurance and is >$100.  Please advise.

## 2020-09-06 NOTE — Telephone Encounter (Signed)
Please cancel Augmentin and instead send in moxifloxacin 400 mg p.o. once daily for 7 days #7 no refills

## 2020-09-06 NOTE — Progress Notes (Signed)
Subjective:    Patient ID: Daniel Rios, male    DOB: 1955/06/30, 65 y.o.   MRN: [id number]  HPI: Daniel Rios is a 65 y.o. male presenting for sinus infection.  Chief Complaint  Patient presents with  . Nasal Congestion    Sx began 3wks ago, sx are getting gradually worse. Taking otc for sx. Having coughing, teeth pain, runny nose etc.  . Hypertension    Wants to know if he should start bk on the 2nd med for bp   UPPER RESPIRATORY TRACT INFECTION Onset: 3 weeks ago COVID vaccination status: COVID testing history: Fever: no Cough: yes; productive Shortness of breath: no Wheezing: no Chest pain: no Chest tightness: no Chest congestion: yes Nasal congestion: yes Runny nose: yes Post nasal drip: yes Sneezing: no Sore throat: no Swollen glands: no Sinus pressure: yes Headache: yes Face pain: yes; maxillary sinuses Toothache: yes Ear pain: no  Ear pressure: no  Eyes red/itching: yes; itchy Eye drainage/crusting:  no Nausea: no  Vomiting: no Diarrhea: no  Change in appetite: no  Loss of taste/smell: no  Rash: no Fatigue: no Sick contacts: no Strep contacts: no  Context: worse Recurrent sinusitis: no Treatments attempted: nasal spray, allergy meds Relief with OTC medications: no   Allergies  Allergen Reactions  . Celebrex [Celecoxib] Swelling and Rash    Outpatient Encounter Medications as of 09/06/2020  Medication Sig  . albuterol (VENTOLIN HFA) 108 (90 Base) MCG/ACT inhaler Inhale 1 puff into the lungs every 6 (six) hours as needed for wheezing or shortness of breath.  Marland Kitchen amLODipine (NORVASC) 5 MG tablet TAKE 1 TABLET(5 MG) BY MOUTH DAILY (Patient taking differently: Take 5 mg by mouth daily.)  . aspirin 81 MG tablet Take 81 mg by mouth daily.  . budesonide-formoterol (SYMBICORT) 160-4.5 MCG/ACT inhaler Inhale 2 puffs into the lungs 2 (two) times daily.  . fluticasone (FLONASE) 50 MCG/ACT nasal spray Place 2 sprays into both nostrils daily.  Marland Kitchen  levocetirizine (XYZAL) 5 MG tablet Take 1 tablet (5 mg total) by mouth every evening.  . Multiple Vitamin (MULTIVITAMIN) tablet Take 1 tablet by mouth daily. One a day 50+  . rosuvastatin (CRESTOR) 20 MG tablet TAKE 1 TABLET(20 MG) BY MOUTH DAILY  . tamsulosin (FLOMAX) 0.4 MG CAPS capsule TAKE 1 CAPSULE BY MOUTH EVERY NIGHT AT BEDTIME (Patient taking differently: Take 0.4 mg by mouth at bedtime.)  . [DISCONTINUED] predniSONE (DELTASONE) 20 MG tablet Take 2 tablets (40 mg total) by mouth daily with breakfast.  . [DISCONTINUED] 0.9 %  sodium chloride infusion    No facility-administered encounter medications on file as of 09/06/2020.    Patient Active Problem List   Diagnosis Date Noted  . COVID-19 virus infection 05/22/2020  . Strabismus   . Hyperlipidemia   . Hypertension     Past Medical History:  Diagnosis Date  . Allergy   . Hyperlipidemia   . Hypertension   . Strabismus     Relevant past medical, surgical, family and social history reviewed and updated as indicated. Interim medical history since our last visit reviewed.  Review of Systems Per HPI unless specifically indicated above     Objective:    BP (!) 148/98 (BP Location: Left Arm, Cuff Size: Normal)   Pulse 88   Temp 98.2 F (36.8 C)   Ht 6' (1.829 m)   Wt 196 lb 3.2 oz (89 kg)   SpO2 98%   BMI 26.61 kg/m   Wt Readings from  Last 3 Encounters:  09/06/20 196 lb 3.2 oz (89 kg)  07/07/20 191 lb (86.6 kg)  06/30/20 187 lb (84.8 kg)    Physical Exam Vitals and nursing note reviewed.  Constitutional:      General: He is not in acute distress.    Appearance: Normal appearance. He is not toxic-appearing.  HENT:     Head: Normocephalic and atraumatic.     Right Ear: Tympanic membrane, ear canal and external ear normal.     Left Ear: Tympanic membrane, ear canal and external ear normal.     Nose: Congestion present. No rhinorrhea.     Mouth/Throat:     Mouth: Mucous membranes are moist.     Pharynx:  Oropharynx is clear. Posterior oropharyngeal erythema present.  Eyes:     General: No scleral icterus.    Extraocular Movements: Extraocular movements intact.  Neck:     Vascular: No carotid bruit.  Cardiovascular:     Rate and Rhythm: Normal rate and regular rhythm.     Heart sounds: Normal heart sounds. No murmur heard.   Pulmonary:     Effort: Pulmonary effort is normal. No respiratory distress.     Breath sounds: No rhonchi or rales.  Musculoskeletal:     Cervical back: Normal range of motion and neck supple.  Lymphadenopathy:     Cervical: No cervical adenopathy.  Skin:    General: Skin is warm and dry.     Coloration: Skin is not jaundiced or pale.     Findings: No erythema.  Neurological:     Mental Status: He is alert and oriented to person, place, and time.     Motor: No weakness.     Gait: Gait normal.  Psychiatric:        Mood and Affect: Mood normal.        Behavior: Behavior normal.        Thought Content: Thought content normal.        Judgment: Judgment normal.       Assessment & Plan:  1. Bacterial sinusitis Acute.  Symptoms ongoing past 3 weeks.   Given length and severity of symptoms, will treat with prednisone burst 40 mg x 5 days.  Also treat with high-dose Augmentin given history of antibiotic use in the past 3 months.  Start guaifenesin to help thin and clear secretions.  Push hydration with water.  Follow-up if not improving by the end of the week.    - predniSONE (DELTASONE) 20 MG tablet; Take 2 tablets (40 mg total) by mouth daily with breakfast.  Dispense: 10 tablet; Refill: 0 - amoxicillin-clavulanate (AUGMENTIN XR) 1000-62.5 MG 12 hr tablet; Take 2 tablets by mouth 2 (two) times daily for 7 days.  Dispense: 28 tablet; Refill: 0 - guaiFENesin (MUCINEX) 600 MG 12 hr tablet; Take 1 tablet (600 mg total) by mouth 2 (two) times daily as needed for cough or to loosen phlegm.  Dispense: 30 tablet; Refill: 0  2. Benign essential HTN Blood pressure  remains elevated upon recheck today in clinic.  Of note, patient is coughing and with acute sinus infection today.  Encouraged patient to keep a log of blood pressure readings at home and bring to next appointment with PCP to discuss resuming previous blood pressure medication.    Follow up plan: Return in about 4 weeks (around 10/04/2020) for check up with pcp.

## 2020-09-06 NOTE — Telephone Encounter (Signed)
Levaquin noted at $5.00  Call placed to patient and patient made aware.

## 2020-09-06 NOTE — Telephone Encounter (Signed)
If levofloxacin is cheaper, please send in instead.  Levofloxacin 750 mg p.o. once daily for 7 days #7 no refills.

## 2020-09-22 ENCOUNTER — Other Ambulatory Visit: Payer: Self-pay | Admitting: Family Medicine

## 2020-09-29 ENCOUNTER — Ambulatory Visit (INDEPENDENT_AMBULATORY_CARE_PROVIDER_SITE_OTHER): Payer: PPO | Admitting: Family Medicine

## 2020-09-29 ENCOUNTER — Encounter: Payer: Self-pay | Admitting: Family Medicine

## 2020-09-29 ENCOUNTER — Other Ambulatory Visit: Payer: Self-pay

## 2020-09-29 VITALS — BP 128/68 | HR 72 | Temp 98.5°F | Resp 16 | Ht 72.0 in | Wt 195.0 lb

## 2020-09-29 DIAGNOSIS — Z0001 Encounter for general adult medical examination with abnormal findings: Secondary | ICD-10-CM

## 2020-09-29 DIAGNOSIS — Z23 Encounter for immunization: Secondary | ICD-10-CM

## 2020-09-29 DIAGNOSIS — I10 Essential (primary) hypertension: Secondary | ICD-10-CM | POA: Diagnosis not present

## 2020-09-29 DIAGNOSIS — Z125 Encounter for screening for malignant neoplasm of prostate: Secondary | ICD-10-CM | POA: Diagnosis not present

## 2020-09-29 DIAGNOSIS — Z0000 Encounter for general adult medical examination without abnormal findings: Secondary | ICD-10-CM

## 2020-09-29 NOTE — Progress Notes (Signed)
Subjective:    Patient ID: Daniel Rios, male    DOB: 1955/11/19, 65 y.o.   MRN: [id number]  HPI  Patient is a very pleasant 65 year old Caucasian male here today for Medicare wellness visit.  Over the last year, the patient has had COVID.  He had some lingering shortness of breath and dyspnea on exertion for almost 2 months after the infection.  Last colonoscopy was May 2021.  He did have 3 polyps.  Surgical pathology was significant for a tubular adenoma.  Repeat colonoscopy was recommended in 7 years.  Overall, the patient states that his breathing is back to his baseline.  He is due for Pneumovax 23.  He is also due for Shingrix.  He is still not had a COVID-vaccine.  I strongly encouraged him to get a COVID shot given the severity of his previous infection.  He would like to get the pneumonia vaccine today.  He denies any falls, depression, or memory loss.  Past Medical History:  Diagnosis Date  . Allergy   . Hyperlipidemia   . Hypertension   . Strabismus    Past Surgical History:  Procedure Laterality Date  . COLONOSCOPY  11-01-2003   normal  . COLONOSCOPY  06/21/2014   1-TA  . FOOT SURGERY Right 2019   Current Outpatient Medications on File Prior to Visit  Medication Sig Dispense Refill  . albuterol (VENTOLIN HFA) 108 (90 Base) MCG/ACT inhaler Inhale 1 puff into the lungs every 6 (six) hours as needed for wheezing or shortness of breath. 18 g 0  . amLODipine (NORVASC) 5 MG tablet TAKE 1 TABLET(5 MG) BY MOUTH DAILY (Patient taking differently: Take 5 mg by mouth daily.) 90 tablet 3  . aspirin 81 MG tablet Take 81 mg by mouth daily.    . budesonide-formoterol (SYMBICORT) 160-4.5 MCG/ACT inhaler Inhale 2 puffs into the lungs 2 (two) times daily. 1 each 3  . fluticasone (FLONASE) 50 MCG/ACT nasal spray Place 2 sprays into both nostrils daily. 16 g 6  . guaiFENesin (MUCINEX) 600 MG 12 hr tablet Take 1 tablet (600 mg total) by mouth 2 (two) times daily as needed for cough or to  loosen phlegm. 30 tablet 0  . levocetirizine (XYZAL) 5 MG tablet Take 1 tablet (5 mg total) by mouth every evening. 30 tablet 5  . levofloxacin (LEVAQUIN) 750 MG tablet Take 1 tablet (750 mg total) by mouth daily. 7 tablet 0  . Multiple Vitamin (MULTIVITAMIN) tablet Take 1 tablet by mouth daily. One a day 50+    . predniSONE (DELTASONE) 20 MG tablet Take 2 tablets (40 mg total) by mouth daily with breakfast. 10 tablet 0  . rosuvastatin (CRESTOR) 20 MG tablet TAKE 1 TABLET(20 MG) BY MOUTH DAILY 90 tablet 0  . tamsulosin (FLOMAX) 0.4 MG CAPS capsule TAKE 1 CAPSULE BY MOUTH EVERY NIGHT AT BEDTIME 90 capsule 3   No current facility-administered medications on file prior to visit.   Allergies  Allergen Reactions  . Celebrex [Celecoxib] Swelling and Rash   Social History   Socioeconomic History  . Marital status: Married    Spouse name: Not on file  . Number of children: Not on file  . Years of education: Not on file  . Highest education level: Not on file  Occupational History  . Not on file  Tobacco Use  . Smoking status: Former Smoker    Years: 2.00    Types: Cigarettes    Quit date: 09/22/1982    Years  since quitting: 38.0  . Smokeless tobacco: Never Used  Vaping Use  . Vaping Use: Never used  Substance and Sexual Activity  . Alcohol use: Yes    Alcohol/week: 7.0 standard drinks    Types: 7 Cans of beer per week  . Drug use: No  . Sexual activity: Not on file    Comment: married, quit smoking in 1982.    Other Topics Concern  . Not on file  Social History Narrative  . Not on file   Social Determinants of Health   Financial Resource Strain: Not on file  Food Insecurity: Not on file  Transportation Needs: Not on file  Physical Activity: Not on file  Stress: Not on file  Social Connections: Not on file  Intimate Partner Violence: Not on file     Review of Systems  All other systems reviewed and are negative.      Objective:   Physical Exam Vitals reviewed.   Constitutional:      General: He is not in acute distress.    Appearance: He is well-developed. He is not diaphoretic.  HENT:     Head: Normocephalic and atraumatic.     Right Ear: External ear normal.     Left Ear: External ear normal.     Nose: Nose normal.     Mouth/Throat:     Pharynx: No oropharyngeal exudate.  Eyes:     General: No scleral icterus.       Right eye: No discharge.        Left eye: No discharge.     Conjunctiva/sclera: Conjunctivae normal.     Pupils: Pupils are equal, round, and reactive to light.  Neck:     Thyroid: No thyromegaly.     Vascular: No JVD.     Trachea: No tracheal deviation.  Cardiovascular:     Rate and Rhythm: Normal rate and regular rhythm.     Heart sounds: Normal heart sounds. No murmur heard. No friction rub. No gallop.   Pulmonary:     Effort: Pulmonary effort is normal. No respiratory distress.     Breath sounds: Normal breath sounds. No stridor. No wheezing or rales.  Chest:     Chest wall: No tenderness.  Abdominal:     General: Bowel sounds are normal. There is no distension.     Palpations: Abdomen is soft. There is no mass.     Tenderness: There is no abdominal tenderness. There is no guarding or rebound.  Musculoskeletal:        General: No tenderness. Normal range of motion.     Cervical back: Normal range of motion and neck supple.  Lymphadenopathy:     Cervical: No cervical adenopathy.  Skin:    General: Skin is warm.     Coloration: Skin is not pale.     Findings: No erythema or rash.  Neurological:     Mental Status: He is alert and oriented to person, place, and time.     Cranial Nerves: No cranial nerve deficit.     Motor: No abnormal muscle tone.     Coordination: Coordination normal.     Deep Tendon Reflexes: Reflexes are normal and symmetric.  Psychiatric:        Behavior: Behavior normal.        Thought Content: Thought content normal.        Judgment: Judgment normal.           Assessment &  Plan:  General medical exam -  Plan: CBC with Differential/Platelet, COMPLETE METABOLIC PANEL WITH GFR, Lipid panel, PSA, Pneumococcal polysaccharide vaccine 23-valent greater than or equal to 2yo subcutaneous/IM  Prostate cancer screening - Plan: PSA  Benign essential HTN - Plan: CBC with Differential/Platelet, COMPLETE METABOLIC PANEL WITH GFR, Lipid panel  Need for prophylactic vaccination against Streptococcus pneumoniae (pneumococcus) - Plan: Pneumococcal polysaccharide vaccine 23-valent greater than or equal to 2yo subcutaneous/IM  After discussing this, the patient and I have decided to switch back to losartan 100 mg a day and discontinue amlodipine.  He will check a CBC, CMP, lipid panel, and a PSA to screen for prostate cancer.  Goal LDL cholesterol is less than 100.  Patient received Pneumovax 23 today.  I encouraged him to get a COVID-vaccine.  Also encouraged him to get the shingles vaccine.  He denies any falls, depression, or memory loss.

## 2020-09-30 LAB — CBC WITH DIFFERENTIAL/PLATELET
Absolute Monocytes: 702 cells/uL (ref 200–950)
Basophils Absolute: 72 cells/uL (ref 0–200)
Basophils Relative: 1.1 %
Eosinophils Absolute: 182 cells/uL (ref 15–500)
Eosinophils Relative: 2.8 %
HCT: 49.5 % (ref 38.5–50.0)
Hemoglobin: 16.4 g/dL (ref 13.2–17.1)
Lymphs Abs: 1866 cells/uL (ref 850–3900)
MCH: 30.9 pg (ref 27.0–33.0)
MCHC: 33.1 g/dL (ref 32.0–36.0)
MCV: 93.2 fL (ref 80.0–100.0)
MPV: 9.1 fL (ref 7.5–12.5)
Monocytes Relative: 10.8 %
Neutro Abs: 3679 cells/uL (ref 1500–7800)
Neutrophils Relative %: 56.6 %
Platelets: 318 10*3/uL (ref 140–400)
RBC: 5.31 10*6/uL (ref 4.20–5.80)
RDW: 13.3 % (ref 11.0–15.0)
Total Lymphocyte: 28.7 %
WBC: 6.5 10*3/uL (ref 3.8–10.8)

## 2020-09-30 LAB — COMPLETE METABOLIC PANEL WITH GFR
AG Ratio: 1.7 (calc) (ref 1.0–2.5)
ALT: 34 U/L (ref 9–46)
AST: 28 U/L (ref 10–35)
Albumin: 4.4 g/dL (ref 3.6–5.1)
Alkaline phosphatase (APISO): 65 U/L (ref 35–144)
BUN: 17 mg/dL (ref 7–25)
CO2: 27 mmol/L (ref 20–32)
Calcium: 10.1 mg/dL (ref 8.6–10.3)
Chloride: 105 mmol/L (ref 98–110)
Creat: 1.19 mg/dL (ref 0.70–1.25)
GFR, Est African American: 73 mL/min/{1.73_m2} (ref >=60)
GFR, Est Non African American: 63 mL/min/{1.73_m2} (ref >=60)
Globulin: 2.6 g/dL (calc) (ref 1.9–3.7)
Glucose, Bld: 110 mg/dL — ABNORMAL HIGH (ref 65–99)
Potassium: 5.3 mmol/L (ref 3.5–5.3)
Sodium: 139 mmol/L (ref 135–146)
Total Bilirubin: 0.6 mg/dL (ref 0.2–1.2)
Total Protein: 7 g/dL (ref 6.1–8.1)

## 2020-09-30 LAB — PSA: PSA: 1.65 ng/mL (ref <=4.00)

## 2020-09-30 LAB — LIPID PANEL
Cholesterol: 193 mg/dL (ref <200)
HDL: 52 mg/dL (ref >=40)
LDL Cholesterol (Calc): 126 mg/dL (calc) — ABNORMAL HIGH
Non-HDL Cholesterol (Calc): 141 mg/dL (calc) — ABNORMAL HIGH (ref <130)
Total CHOL/HDL Ratio: 3.7 (calc) (ref <5.0)
Triglycerides: 63 mg/dL (ref <150)

## 2020-11-01 DIAGNOSIS — L814 Other melanin hyperpigmentation: Secondary | ICD-10-CM | POA: Diagnosis not present

## 2020-11-01 DIAGNOSIS — L570 Actinic keratosis: Secondary | ICD-10-CM | POA: Diagnosis not present

## 2020-11-01 DIAGNOSIS — L821 Other seborrheic keratosis: Secondary | ICD-10-CM | POA: Diagnosis not present

## 2020-11-01 DIAGNOSIS — Z85828 Personal history of other malignant neoplasm of skin: Secondary | ICD-10-CM | POA: Diagnosis not present

## 2020-11-01 DIAGNOSIS — L819 Disorder of pigmentation, unspecified: Secondary | ICD-10-CM | POA: Diagnosis not present

## 2020-11-01 DIAGNOSIS — L812 Freckles: Secondary | ICD-10-CM | POA: Diagnosis not present

## 2020-11-01 DIAGNOSIS — D1801 Hemangioma of skin and subcutaneous tissue: Secondary | ICD-10-CM | POA: Diagnosis not present

## 2020-11-01 DIAGNOSIS — L905 Scar conditions and fibrosis of skin: Secondary | ICD-10-CM | POA: Diagnosis not present

## 2020-11-25 ENCOUNTER — Encounter: Payer: Self-pay | Admitting: Family Medicine

## 2020-11-29 ENCOUNTER — Other Ambulatory Visit: Payer: Self-pay | Admitting: Family Medicine

## 2020-11-29 MED ORDER — MONTELUKAST SODIUM 10 MG PO TABS: 10.0000 mg | ORAL_TABLET | Freq: Every day | ORAL | 3 refills | Status: DC

## 2020-12-21 ENCOUNTER — Ambulatory Visit: Payer: Self-pay

## 2020-12-21 ENCOUNTER — Telehealth: Payer: Self-pay | Admitting: *Deleted

## 2020-12-21 NOTE — Telephone Encounter (Signed)
Received call from patient.   Reports that he has been taking Singulair x3 weeks for allergy like Sx. Reports that since starting medication, he has noted increased HA, stuffiness, congestion, cough and fatigue.   Reports that home COVID tests are negative. Denies fever, body aches, SOB, chest pain.   States that he feels he is having SE to Singulair. Advised that he can stop medication to see if Sx resolve/improve.  Advised if Sx worsen or continue, contact office for appointment for evaluation.

## 2020-12-22 ENCOUNTER — Other Ambulatory Visit: Payer: Self-pay | Admitting: *Deleted

## 2020-12-22 ENCOUNTER — Telehealth: Payer: Self-pay

## 2020-12-22 ENCOUNTER — Other Ambulatory Visit: Payer: Self-pay | Admitting: Family Medicine

## 2020-12-22 DIAGNOSIS — J329 Chronic sinusitis, unspecified: Secondary | ICD-10-CM

## 2020-12-22 MED ORDER — AMOXICILLIN-POT CLAVULANATE 875-125 MG PO TABS: 1.0000 | ORAL_TABLET | Freq: Two times a day (BID) | ORAL | 0 refills | Status: DC

## 2020-12-22 MED ORDER — PREDNISONE 20 MG PO TABS: ORAL_TABLET | ORAL | 0 refills | Status: DC

## 2020-12-22 NOTE — Telephone Encounter (Signed)
Call placed to patient and patient made aware.  

## 2020-12-22 NOTE — Telephone Encounter (Addendum)
Denied.   Patient [place] pharmacy [place]

## 2020-12-22 NOTE — Telephone Encounter (Signed)
Pt's wife called in asking the meds that were just refilled be sent to another pharmacy as they are out of town. Pt would like these two meds predniSONE (DELTASONE) 20 MG ,amoxicillin-clavulanate (AUGMENTIN) 875-125 MG resent to [facility] on [address] in [place], [place].  Cb#: [phone]

## 2021-01-11 ENCOUNTER — Other Ambulatory Visit: Payer: PPO

## 2021-01-16 ENCOUNTER — Ambulatory Visit
Admission: RE | Admit: 2021-01-16 | Discharge: 2021-01-16 | Disposition: A | Discharge status: Home / Self Care | Payer: PPO | Source: Ambulatory Visit | Attending: Family Medicine | Admitting: Family Medicine

## 2021-01-16 ENCOUNTER — Other Ambulatory Visit: Payer: Self-pay

## 2021-01-16 DIAGNOSIS — J329 Chronic sinusitis, unspecified: Secondary | ICD-10-CM

## 2021-02-14 ENCOUNTER — Other Ambulatory Visit: Payer: Self-pay | Admitting: Family Medicine

## 2021-02-14 DIAGNOSIS — E7800 Pure hypercholesterolemia, unspecified: Secondary | ICD-10-CM

## 2021-02-15 ENCOUNTER — Other Ambulatory Visit: Payer: Self-pay | Admitting: Family Medicine

## 2021-04-10 ENCOUNTER — Ambulatory Visit: Payer: Self-pay | Admitting: Family Medicine

## 2021-04-17 ENCOUNTER — Other Ambulatory Visit: Payer: Self-pay

## 2021-04-17 ENCOUNTER — Ambulatory Visit (INDEPENDENT_AMBULATORY_CARE_PROVIDER_SITE_OTHER): Payer: PPO | Admitting: Family Medicine

## 2021-04-17 VITALS — BP 167/68 | HR 63 | Temp 98.4°F | Wt 198.0 lb

## 2021-04-17 DIAGNOSIS — I10 Essential (primary) hypertension: Secondary | ICD-10-CM

## 2021-04-17 DIAGNOSIS — E7800 Pure hypercholesterolemia, unspecified: Secondary | ICD-10-CM | POA: Diagnosis not present

## 2021-04-17 DIAGNOSIS — M79642 Pain in left hand: Secondary | ICD-10-CM | POA: Diagnosis not present

## 2021-04-17 LAB — COMPLETE METABOLIC PANEL WITH GFR
AG Ratio: 1.5 (calc) (ref 1.0–2.5)
ALT: 31 U/L (ref 9–46)
AST: 30 U/L (ref 10–35)
Albumin: 4.4 g/dL (ref 3.6–5.1)
Alkaline phosphatase (APISO): 62 U/L (ref 35–144)
BUN: 21 mg/dL (ref 7–25)
CO2: 28 mmol/L (ref 20–32)
Calcium: 9.4 mg/dL (ref 8.6–10.3)
Chloride: 104 mmol/L (ref 98–110)
Creat: 1.16 mg/dL (ref 0.70–1.35)
Globulin: 2.9 g/dL (calc) (ref 1.9–3.7)
Glucose, Bld: 119 mg/dL — ABNORMAL HIGH (ref 65–99)
Potassium: 5.4 mmol/L — ABNORMAL HIGH (ref 3.5–5.3)
Sodium: 139 mmol/L (ref 135–146)
Total Bilirubin: 0.7 mg/dL (ref 0.2–1.2)
Total Protein: 7.3 g/dL (ref 6.1–8.1)
eGFR: 69 mL/min/{1.73_m2} (ref >=60)

## 2021-04-17 LAB — CBC WITH DIFFERENTIAL/PLATELET
Absolute Monocytes: 643 cells/uL (ref 200–950)
Basophils Absolute: 80 cells/uL (ref 0–200)
Basophils Relative: 1.2 %
Eosinophils Absolute: 168 cells/uL (ref 15–500)
Eosinophils Relative: 2.5 %
HCT: 47.9 % (ref 38.5–50.0)
Hemoglobin: 16.2 g/dL (ref 13.2–17.1)
Lymphs Abs: 2439 cells/uL (ref 850–3900)
MCH: 33.3 pg — ABNORMAL HIGH (ref 27.0–33.0)
MCHC: 33.8 g/dL (ref 32.0–36.0)
MCV: 98.4 fL (ref 80.0–100.0)
MPV: 9.7 fL (ref 7.5–12.5)
Monocytes Relative: 9.6 %
Neutro Abs: 3370 cells/uL (ref 1500–7800)
Neutrophils Relative %: 50.3 %
Platelets: 313 10*3/uL (ref 140–400)
RBC: 4.87 10*6/uL (ref 4.20–5.80)
RDW: 12.3 % (ref 11.0–15.0)
Total Lymphocyte: 36.4 %
WBC: 6.7 10*3/uL (ref 3.8–10.8)

## 2021-04-17 LAB — LIPID PANEL
Cholesterol: 178 mg/dL (ref <200)
HDL: 54 mg/dL (ref >=40)
LDL Cholesterol (Calc): 109 mg/dL (calc) — ABNORMAL HIGH
Non-HDL Cholesterol (Calc): 124 mg/dL (calc) (ref <130)
Total CHOL/HDL Ratio: 3.3 (calc) (ref <5.0)
Triglycerides: 68 mg/dL (ref <150)

## 2021-04-17 MED ORDER — AZELASTINE HCL 0.1 % NA SOLN: 1.0000 | Freq: Two times a day (BID) | NASAL | 11 refills | Status: DC

## 2021-04-17 MED ORDER — AMLODIPINE BESYLATE 5 MG PO TABS: ORAL_TABLET | ORAL | 3 refills | Status: DC

## 2021-04-17 NOTE — Progress Notes (Signed)
Subjective:    Patient ID: Daniel Rios, male    DOB: [birth year], 65 y.o.   MRN: [id number]  HPI  Patient is a very pleasant 65 year old Caucasian male here today for follow up of his chronic medical problems. On labs in May, blood sugar was >110 and LDL was >120.  However, patient had been sedentary after COVID.  Here today to recheck.  After COVID, we stopped amlodipine due to hypotension.  However now his blood pressure has consistently been over 140.  He is seeing blood pressure is 150 or higher at home.  He also complains of pain in his left third MCP joint.  He states that with certain motion it feels like someone stabbing him right inside the knuckle.  He denies any falls or injuries.  There is no palpable abnormality in or around the joint.  There is no locking.  There is no palpable nodule. Past Medical History:  Diagnosis Date   Allergy    Hyperlipidemia    Hypertension    Strabismus    Past Surgical History:  Procedure Laterality Date   COLONOSCOPY  11-01-2003   normal   COLONOSCOPY  06/21/2014   1-TA   FOOT SURGERY Right 2019   Current Outpatient Medications on File Prior to Visit  Medication Sig Dispense Refill   amLODipine (NORVASC) 5 MG tablet TAKE 1 TABLET(5 MG) BY MOUTH DAILY (Patient taking differently: Take 5 mg by mouth daily.) 90 tablet 3   aspirin 81 MG tablet Take 81 mg by mouth daily.     cetirizine (ZYRTEC) 10 MG tablet Take 10 mg by mouth daily.     fluticasone (FLONASE) 50 MCG/ACT nasal spray Place 2 sprays into both nostrils daily. 16 g 6   losartan (COZAAR) 100 MG tablet TAKE 1 TABLET(100 MG) BY MOUTH DAILY 90 tablet 3   montelukast (SINGULAIR) 10 MG tablet TAKE 1 TABLET(10 MG) BY MOUTH AT BEDTIME 30 tablet 3   Multiple Vitamin (MULTIVITAMIN) tablet Take 1 tablet by mouth daily. One a day 50+     rosuvastatin (CRESTOR) 20 MG tablet TAKE 1 TABLET(20 MG) BY MOUTH DAILY 90 tablet 0   tamsulosin (FLOMAX) 0.4 MG CAPS capsule TAKE 1 CAPSULE BY MOUTH EVERY  NIGHT AT BEDTIME 90 capsule 3   No current facility-administered medications on file prior to visit.     Allergies  Allergen Reactions   Celebrex [Celecoxib] Swelling and Rash   Social History   Socioeconomic History   Marital status: Married    Spouse name: Not on file   Number of children: Not on file   Years of education: Not on file   Highest education level: Not on file  Occupational History   Not on file  Tobacco Use   Smoking status: Former    Years: 2.00    Types: Cigarettes    Quit date: 09/22/1982    Years since quitting: 38.5   Smokeless tobacco: Never  Vaping Use   Vaping Use: Never used  Substance and Sexual Activity   Alcohol use: Yes    Alcohol/week: 7.0 standard drinks    Types: 7 Cans of beer per week   Drug use: No   Sexual activity: Not on file    Comment: married, quit smoking in 1982.    Other Topics Concern   Not on file  Social History Narrative   Not on file   Social Determinants of Health   Financial Resource Strain: Not on file  Food Insecurity: Not  on file  Transportation Needs: Not on file  Physical Activity: Not on file  Stress: Not on file  Social Connections: Not on file  Intimate Partner Violence: Not on file     Review of Systems  All other systems reviewed and are negative.     Objective:   Physical Exam Vitals reviewed.  Constitutional:      General: He is not in acute distress.    Appearance: He is well-developed. He is not diaphoretic.  HENT:     Head: Normocephalic and atraumatic.     Right Ear: External ear normal.     Left Ear: External ear normal.     Nose: Nose normal.     Mouth/Throat:     Pharynx: No oropharyngeal exudate.  Eyes:     General: No scleral icterus.       Right eye: No discharge.        Left eye: No discharge.     Conjunctiva/sclera: Conjunctivae normal.     Pupils: Pupils are equal, round, and reactive to light.  Neck:     Thyroid: No thyromegaly.     Vascular: No JVD.     Trachea:  No tracheal deviation.  Cardiovascular:     Rate and Rhythm: Normal rate and regular rhythm.     Heart sounds: Normal heart sounds. No murmur heard.   No friction rub. No gallop.  Pulmonary:     Effort: Pulmonary effort is normal. No respiratory distress.     Breath sounds: Normal breath sounds. No stridor. No wheezing or rales.  Chest:     Chest wall: No tenderness.  Abdominal:     General: Bowel sounds are normal. There is no distension.     Palpations: Abdomen is soft. There is no mass.     Tenderness: There is no abdominal tenderness. There is no guarding or rebound.  Musculoskeletal:        General: No tenderness. Normal range of motion.     Cervical back: Normal range of motion and neck supple.  Lymphadenopathy:     Cervical: No cervical adenopathy.  Skin:    General: Skin is warm.     Coloration: Skin is not pale.     Findings: No erythema or rash.  Neurological:     Mental Status: He is alert and oriented to person, place, and time.     Cranial Nerves: No cranial nerve deficit.     Motor: No abnormal muscle tone.     Coordination: Coordination normal.     Deep Tendon Reflexes: Reflexes are normal and symmetric.  Psychiatric:        Behavior: Behavior normal.        Thought Content: Thought content normal.        Judgment: Judgment normal.          Assessment & Plan:  Benign essential HTN - Plan: CBC with Differential/Platelet, COMPLETE METABOLIC PANEL WITH GFR, Lipid panel  Pure hypercholesterolemia - Plan: CBC with Differential/Platelet, COMPLETE METABOLIC PANEL WITH GFR, Lipid panel  Left hand pain - Plan: DG Hand Complete Left Blood pressure is elevated.  Resume amlodipine 5 mg a day and recheck blood pressure in 2 weeks.  Check CBC CMP and fasting lipid panel.  Goal LDL cholesterol is less than 100.  I will also like to see his blood sugar less than 100.  Obtain an x-ray of the left hand.  I suspect arthritis in the MCP joint but I want to confirm that  first  given the isolated nature.

## 2021-04-19 ENCOUNTER — Ambulatory Visit
Admission: RE | Admit: 2021-04-19 | Discharge: 2021-04-19 | Disposition: A | Discharge status: Home / Self Care | Payer: PPO | Source: Ambulatory Visit | Attending: Family Medicine | Admitting: Family Medicine

## 2021-04-19 DIAGNOSIS — M79642 Pain in left hand: Secondary | ICD-10-CM

## 2021-04-19 DIAGNOSIS — M19042 Primary osteoarthritis, left hand: Secondary | ICD-10-CM | POA: Diagnosis not present

## 2021-05-03 DIAGNOSIS — Z85828 Personal history of other malignant neoplasm of skin: Secondary | ICD-10-CM | POA: Diagnosis not present

## 2021-05-03 DIAGNOSIS — L821 Other seborrheic keratosis: Secondary | ICD-10-CM | POA: Diagnosis not present

## 2021-05-03 DIAGNOSIS — L819 Disorder of pigmentation, unspecified: Secondary | ICD-10-CM | POA: Diagnosis not present

## 2021-05-03 DIAGNOSIS — Z08 Encounter for follow-up examination after completed treatment for malignant neoplasm: Secondary | ICD-10-CM | POA: Diagnosis not present

## 2021-05-03 DIAGNOSIS — L570 Actinic keratosis: Secondary | ICD-10-CM | POA: Diagnosis not present

## 2021-05-03 DIAGNOSIS — L814 Other melanin hyperpigmentation: Secondary | ICD-10-CM | POA: Diagnosis not present

## 2021-05-16 ENCOUNTER — Other Ambulatory Visit: Payer: Self-pay | Admitting: Family Medicine

## 2021-05-16 DIAGNOSIS — E7800 Pure hypercholesterolemia, unspecified: Secondary | ICD-10-CM

## 2021-06-27 DIAGNOSIS — C44712 Basal cell carcinoma of skin of right lower limb, including hip: Secondary | ICD-10-CM | POA: Diagnosis not present

## 2021-06-27 DIAGNOSIS — D2262 Melanocytic nevi of left upper limb, including shoulder: Secondary | ICD-10-CM | POA: Diagnosis not present

## 2021-06-27 DIAGNOSIS — D2261 Melanocytic nevi of right upper limb, including shoulder: Secondary | ICD-10-CM | POA: Diagnosis not present

## 2021-06-27 DIAGNOSIS — L814 Other melanin hyperpigmentation: Secondary | ICD-10-CM | POA: Diagnosis not present

## 2021-06-27 DIAGNOSIS — D2271 Melanocytic nevi of right lower limb, including hip: Secondary | ICD-10-CM | POA: Diagnosis not present

## 2021-06-27 DIAGNOSIS — D485 Neoplasm of uncertain behavior of skin: Secondary | ICD-10-CM | POA: Diagnosis not present

## 2021-06-27 DIAGNOSIS — L570 Actinic keratosis: Secondary | ICD-10-CM | POA: Diagnosis not present

## 2021-06-27 DIAGNOSIS — D2272 Melanocytic nevi of left lower limb, including hip: Secondary | ICD-10-CM | POA: Diagnosis not present

## 2021-06-27 DIAGNOSIS — X32XXXA Exposure to sunlight, initial encounter: Secondary | ICD-10-CM | POA: Diagnosis not present

## 2021-06-27 DIAGNOSIS — D225 Melanocytic nevi of trunk: Secondary | ICD-10-CM | POA: Diagnosis not present

## 2021-07-17 ENCOUNTER — Ambulatory Visit: Payer: Self-pay

## 2021-07-26 DIAGNOSIS — C44712 Basal cell carcinoma of skin of right lower limb, including hip: Secondary | ICD-10-CM | POA: Diagnosis not present

## 2021-08-09 ENCOUNTER — Encounter: Payer: Self-pay | Admitting: Emergency Medicine

## 2021-08-09 ENCOUNTER — Other Ambulatory Visit: Payer: Self-pay

## 2021-08-09 ENCOUNTER — Ambulatory Visit: Payer: Self-pay

## 2021-08-09 ENCOUNTER — Ambulatory Visit
Admission: EM | Admit: 2021-08-09 | Discharge: 2021-08-09 | Disposition: A | Discharge status: Home / Self Care | Payer: PPO | Attending: Urgent Care | Admitting: Urgent Care

## 2021-08-09 ENCOUNTER — Ambulatory Visit (INDEPENDENT_AMBULATORY_CARE_PROVIDER_SITE_OTHER): Payer: PPO

## 2021-08-09 DIAGNOSIS — J209 Acute bronchitis, unspecified: Secondary | ICD-10-CM

## 2021-08-09 DIAGNOSIS — R059 Cough, unspecified: Secondary | ICD-10-CM | POA: Diagnosis not present

## 2021-08-09 DIAGNOSIS — R053 Chronic cough: Secondary | ICD-10-CM | POA: Diagnosis not present

## 2021-08-09 MED ORDER — PROMETHAZINE-DM 6.25-15 MG/5ML PO SYRP: 5.0000 mL | ORAL_SOLUTION | Freq: Four times a day (QID) | ORAL | 0 refills | Status: DC | PRN

## 2021-08-09 MED ORDER — PREDNISONE 20 MG PO TABS: ORAL_TABLET | ORAL | 0 refills | Status: DC

## 2021-08-09 NOTE — ED Provider Notes (Signed)
?[place] ? ? ?MRN: [id number] DOB: 1955/05/24 ? ?Subjective:  ? ?Daniel Rios is a 66 y.o. male presenting for 5-week history of persistent coughing, wheezing.  The cough is intermittently productive.  Has a feeling of chest congestion but no chest pain or shortness of breath.  Usually has an illness like this including bronchitis once a year.  No history of asthma.  He did have COVID-19 last year. ? ?No current facility-administered medications for this encounter. ? ?Current Outpatient Medications:  ?  amLODipine (NORVASC) 5 MG tablet, TAKE 1 TABLET(5 MG) BY MOUTH DAILY, Disp: 90 tablet, Rfl: 3 ?  aspirin 81 MG tablet, Take 81 mg by mouth daily., Disp: , Rfl:  ?  azelastine (ASTELIN) 0.1 % nasal spray, Place 1 spray into both nostrils 2 (two) times daily. Use in each nostril as directed, Disp: 30 mL, Rfl: 11 ?  cetirizine (ZYRTEC) 10 MG tablet, Take 10 mg by mouth daily., Disp: , Rfl:  ?  fluticasone (FLONASE) 50 MCG/ACT nasal spray, Place 2 sprays into both nostrils daily. (Patient not taking: Reported on 04/17/2021), Disp: 16 g, Rfl: 6 ?  losartan (COZAAR) 100 MG tablet, TAKE 1 TABLET(100 MG) BY MOUTH DAILY, Disp: 90 tablet, Rfl: 3 ?  montelukast (SINGULAIR) 10 MG tablet, TAKE 1 TABLET(10 MG) BY MOUTH AT BEDTIME (Patient not taking: Reported on 04/17/2021), Disp: 30 tablet, Rfl: 3 ?  Multiple Vitamin (MULTIVITAMIN) tablet, Take 1 tablet by mouth daily. One a day 50+, Disp: , Rfl:  ?  rosuvastatin (CRESTOR) 20 MG tablet, TAKE 1 TABLET(20 MG) BY MOUTH DAILY, Disp: 90 tablet, Rfl: 0 ?  tamsulosin (FLOMAX) 0.4 MG CAPS capsule, TAKE 1 CAPSULE BY MOUTH EVERY NIGHT AT BEDTIME, Disp: 90 capsule, Rfl: 3  ? ?Allergies  ?Allergen Reactions  ? Celebrex [Celecoxib] Swelling and Rash  ? ? ?Past Medical History:  ?Diagnosis Date  ? Allergy   ? Hyperlipidemia   ? Hypertension   ? Strabismus   ?  ? ?Past Surgical History:  ?Procedure Laterality Date  ? COLONOSCOPY  11-01-2003  ? normal  ? COLONOSCOPY   06/21/2014  ? 1-TA  ? FOOT SURGERY Right 2019  ? ? ?Family History  ?Problem Relation Age of Onset  ? Arthritis Mother   ? Cancer Father   ?     had metas.all over when found-? starting point   ? COPD Brother   ? Colon cancer Maternal Aunt   ? Colon polyps Neg Hx   ? Esophageal cancer Neg Hx   ? Rectal cancer Neg Hx   ? Stomach cancer Neg Hx   ? ? ?Social History  ? ?Tobacco Use  ? Smoking status: Former  ?  Years: 2.00  ?  Types: Cigarettes  ?  Quit date: 09/22/1982  ?  Years since quitting: 38.9  ? Smokeless tobacco: Never  ?Vaping Use  ? Vaping Use: Never used  ?Substance Use Topics  ? Alcohol use: Yes  ?  Alcohol/week: 7.0 standard drinks  ?  Types: 7 Cans of beer per week  ? Drug use: No  ? ? ?ROS ? ? ?Objective:  ? ?Vitals: ?BP (!) 149/91 (BP Location: Right Arm)   Pulse 83   Temp 98 ?F (36.7 ?C) (Oral)   Resp 18   SpO2 97%  ? ?Physical Exam ?Constitutional:   ?   General: He is not in acute distress. ?   Appearance: Normal appearance. He is well-developed and normal weight. He is not ill-appearing, toxic-appearing  or diaphoretic.  ?HENT:  ?   Head: Normocephalic and atraumatic.  ?   Right Ear: Tympanic membrane, ear canal and external ear normal. There is no impacted cerumen.  ?   Left Ear: Tympanic membrane, ear canal and external ear normal. There is no impacted cerumen.  ?   Nose: Congestion present. No rhinorrhea.  ?   Mouth/Throat:  ?   Mouth: Mucous membranes are moist.  ?   Pharynx: No oropharyngeal exudate or posterior oropharyngeal erythema.  ?Eyes:  ?   General: No scleral icterus.    ?   Right eye: No discharge.     ?   Left eye: No discharge.  ?   Extraocular Movements: Extraocular movements intact.  ?   Conjunctiva/sclera: Conjunctivae normal.  ?Cardiovascular:  ?   Rate and Rhythm: Normal rate and regular rhythm.  ?   Heart sounds: Normal heart sounds. No murmur heard. ?  No friction rub. No gallop.  ?Pulmonary:  ?   Effort: Pulmonary effort is normal. No respiratory distress.  ?   Breath  sounds: Normal breath sounds. No stridor. No wheezing, rhonchi or rales.  ?Musculoskeletal:  ?   Cervical back: Normal range of motion and neck supple. No rigidity. No muscular tenderness.  ?Neurological:  ?   General: No focal deficit present.  ?   Mental Status: He is alert and oriented to person, place, and time.  ?Psychiatric:     ?   Mood and Affect: Mood normal.     ?   Behavior: Behavior normal.     ?   Thought Content: Thought content normal.  ? ?DG Chest 2 View ? ?Result Date: 08/09/2021 ?CLINICAL DATA:  Cough for 5 weeks. EXAM: CHEST - 2 VIEW COMPARISON:  06/21/2020 and CT chest 07/01/2020. FINDINGS: Trachea is midline. Heart size normal. Marked interval improvement in bilateral airspace opacification with possible residual scarring bilaterally. No pleural fluid. IMPRESSION: Suspect residual postinfectious/postinflammatory scarring in the lungs bilaterally. Electronically Signed   By: Lorin Picket M.D.   On: 08/09/2021 15:12   ? ?Assessment and Plan :  ? ?PDMP not reviewed this encounter. ? ?1. Acute bronchitis, unspecified organism   ?2. Persistent cough   ? ?Recommended an oral prednisone course, supportive care. Counseled patient on potential for adverse effects with medications prescribed/recommended today, ER and return-to-clinic precautions discussed, patient verbalized understanding. ? ?  ?Jaynee Eagles, PA-C ?08/09/21 1527 ? ?

## 2021-08-09 NOTE — ED Triage Notes (Signed)
Cough and wheezing x 5 weeks.  Cough productive at times with clear sputum.   ?

## 2021-08-14 ENCOUNTER — Other Ambulatory Visit: Payer: Self-pay | Admitting: Family Medicine

## 2021-08-14 DIAGNOSIS — E7800 Pure hypercholesterolemia, unspecified: Secondary | ICD-10-CM

## 2021-10-05 ENCOUNTER — Ambulatory Visit (INDEPENDENT_AMBULATORY_CARE_PROVIDER_SITE_OTHER): Payer: PPO

## 2021-10-05 VITALS — BP 118/76 | HR 65 | Ht 71.0 in | Wt 180.0 lb

## 2021-10-05 DIAGNOSIS — Z0000 Encounter for general adult medical examination without abnormal findings: Secondary | ICD-10-CM | POA: Diagnosis not present

## 2021-10-05 NOTE — Progress Notes (Signed)
Subjective:   Daniel Rios is a 66 y.o. male who presents for Medicare Annual/Subsequent preventive examination.  Review of Systems     Cardiac Risk Factors include: hypertension;advanced age (>81mn, >>42women);dyslipidemia;male gender;sedentary lifestyle     Objective:    Today's Vitals   10/05/21 1530  BP: 118/76  Pulse: 65  SpO2: 97%  Weight: 180 lb (81.6 kg)  Height: '5\' 11"'$  (1.803 m)   Body mass index is 25.1 kg/m.     10/05/2021    3:44 PM 09/29/2020    8:20 AM 05/23/2020    4:34 PM 05/22/2020   12:19 PM 02/07/2017   10:29 AM 05/07/2015    1:47 PM 06/08/2014   12:48 PM  Advanced Directives  Does Patient Have a Medical Advance Directive? No No  No No No No  Would patient like information on creating a medical advance directive? No - Patient declined No - Patient declined No - Patient declined  No - Patient declined No - patient declined information     Current Medications (verified) Outpatient Encounter Medications as of 10/05/2021  Medication Sig   amLODipine (NORVASC) 5 MG tablet TAKE 1 TABLET(5 MG) BY MOUTH DAILY   losartan (COZAAR) 100 MG tablet TAKE 1 TABLET(100 MG) BY MOUTH DAILY   Multiple Vitamin (MULTIVITAMIN) tablet Take 1 tablet by mouth daily. One a day 50+   rosuvastatin (CRESTOR) 20 MG tablet TAKE 1 TABLET(20 MG) BY MOUTH DAILY   tamsulosin (FLOMAX) 0.4 MG CAPS capsule TAKE 1 CAPSULE BY MOUTH EVERY NIGHT AT BEDTIME   [DISCONTINUED] aspirin 81 MG tablet Take 81 mg by mouth daily.   [DISCONTINUED] azelastine (ASTELIN) 0.1 % nasal spray Place 1 spray into both nostrils 2 (two) times daily. Use in each nostril as directed   [DISCONTINUED] cetirizine (ZYRTEC) 10 MG tablet Take 10 mg by mouth daily.   [DISCONTINUED] fluticasone (FLONASE) 50 MCG/ACT nasal spray Place 2 sprays into both nostrils daily. (Patient not taking: Reported on 04/17/2021)   [DISCONTINUED] montelukast (SINGULAIR) 10 MG tablet TAKE 1 TABLET(10 MG) BY MOUTH AT BEDTIME (Patient not taking:  Reported on 04/17/2021)   [DISCONTINUED] predniSONE (DELTASONE) 20 MG tablet Take 2 tablets daily with breakfast.   [DISCONTINUED] promethazine-dextromethorphan (PROMETHAZINE-DM) 6.25-15 MG/5ML syrup Take 5 mLs by mouth 4 (four) times daily as needed for cough.   No facility-administered encounter medications on file as of 10/05/2021.    Allergies (verified) Celebrex [celecoxib]   History: Past Medical History:  Diagnosis Date   Allergy    Hyperlipidemia    Hypertension    Strabismus    Past Surgical History:  Procedure Laterality Date   COLONOSCOPY  11-01-2003   normal   COLONOSCOPY  06/21/2014   1-TA   FOOT SURGERY Right 2019   Family History  Problem Relation Age of Onset   Arthritis Mother    Cancer Father        had metas.all over when found-? starting point    COPD Brother    Colon cancer Maternal Aunt    Colon polyps Neg Hx    Esophageal cancer Neg Hx    Rectal cancer Neg Hx    Stomach cancer Neg Hx    Social History   Socioeconomic History   Marital status: Married    Spouse name: Not on file   Number of children: Not on file   Years of education: Not on file   Highest education level: Not on file  Occupational History   Not on file  Tobacco Use   Smoking status: Former    Packs/day: 0.00    Years: 2.00    Pack years: 0.00    Types: Cigarettes    Quit date: 09/22/1982    Years since quitting: 39.0   Smokeless tobacco: Never  Vaping Use   Vaping Use: Never used  Substance and Sexual Activity   Alcohol use: Yes   Drug use: No   Sexual activity: Not on file    Comment: married, quit smoking in 1982.    Other Topics Concern   Not on file  Social History Narrative   Not on file   Social Determinants of Health   Financial Resource Strain: Low Risk    Difficulty of Paying Living Expenses: Not hard at all  Food Insecurity: No Food Insecurity   Worried About Charity fundraiser in the Last Year: Never true   [place] in the Last Year: Never  true  Transportation Needs: No Transportation Needs   Lack of Transportation (Medical): No   Lack of Transportation (Non-Medical): No  Physical Activity: Sufficiently Active   Days of Exercise per Week: 5 days   Minutes of Exercise per Session: 30 min  Stress: No Stress Concern Present   Feeling of Stress : Not at all  Social Connections: Socially Integrated   Frequency of Communication with Friends and Family: More than three times a week   Frequency of Social Gatherings with Friends and Family: More than three times a week   Attends Religious Services: 1 to 4 times per year   Active Member of [employer] or Organizations: Yes   Attends Music therapist: More than 4 times per year   Marital Status: Married    Tobacco Counseling Counseling given: Not Answered   Clinical Intake:  Pre-visit preparation completed: Yes  Pain : No/denies pain     BMI - recorded: 25.1 Nutritional Status: BMI 25 -29 Overweight Nutritional Risks: None Diabetes: No  How often do you need to have someone help you when you read instructions, pamphlets, or other written materials from your doctor or pharmacy?: 1 - Never  Diabetic?NO  Interpreter Needed?: No  Information entered by :: mj Treshaun Carrico, lpn   Activities of Daily Living    10/05/2021    3:45 PM  In your present state of health, do you have any difficulty performing the following activities:  Hearing? 0  Vision? 0  Difficulty concentrating or making decisions? 0  Walking or climbing stairs? 0  Dressing or bathing? 0  Doing errands, shopping? 0  Preparing Food and eating ? N  Using the Toilet? N  In the past six months, have you accidently leaked urine? Y  Comment Currently on Flowmax  Do you have problems with loss of bowel control? N  Managing your Medications? N  Managing your Finances? N  Housekeeping or managing your Housekeeping? N    Patient Care Team: Susy Frizzle, MD as PCP - General (Family  Medicine)  Indicate any recent Medical Services you may have received from other than [facility] providers in the past year (date may be approximate).     Assessment:   This is a routine wellness examination for Abdulahi.  Hearing/Vision screen Hearing Screening - Comments:: No hearing issues. High pitched R ear.  Vision Screening - Comments:: Glasses. [employer], 2022.  Dietary issues and exercise activities discussed: Current Exercise Habits: The patient has a physically strenuous job, but has no regular exercise apart from work.,  Exercise limited by: cardiac condition(s)   Goals Addressed             This Visit's Progress    Exercise 3x per week (30 min per time)       Stay active and eat healthy.       Depression Screen    10/05/2021    3:35 PM 09/29/2020    8:18 AM 08/17/2019    8:21 AM 03/24/2018    9:29 AM 02/07/2017   10:28 AM 02/07/2017   10:27 AM  PHQ 2/9 Scores  PHQ - 2 Score 0 0 0 0 0 0  PHQ- 9 Score  0   0   Exception Documentation      Patient refusal    Fall Risk    10/05/2021    3:44 PM 09/29/2020    8:18 AM 08/17/2019    8:21 AM 04/15/2019   11:18 AM 03/24/2018    9:28 AM  Fall Risk   Falls in the past year? 0 0 0 0 0  Comment    Emmi Telephone Survey: data to providers prior to load   Number falls in past yr: 0    0  Injury with Fall? 0      Risk for fall due to : No Fall Risks No Fall Risks     Follow up Falls prevention discussed Falls evaluation completed Falls evaluation completed  Falls evaluation completed    [place]:  Any stairs in or around the home? Yes  If so, are there any without handrails? No  Home free of loose throw rugs in walkways, pet beds, electrical cords, etc? Yes  Adequate lighting in your home to reduce risk of falls? Yes   ASSISTIVE DEVICES UTILIZED TO PREVENT FALLS:  Life alert? No  Use of a cane, walker or w/c? No  Grab bars in the bathroom? No  Shower chair or bench in shower? No   Elevated toilet seat or a handicapped toilet? No   TIMED UP AND GO:  Was the test performed? No .  Length of time to ambulate 10 feet: 8 sec.   Gait steady and fast without use of assistive device  Cognitive Function:        10/05/2021    3:46 PM  6CIT Screen  What Year? 0 points  What month? 0 points  What time? 0 points  Count back from 20 0 points  Months in reverse 0 points  Repeat phrase 0 points  Total Score 0 points    Immunizations Immunization History  Administered Date(s) Administered   Influenza Split 03/22/2014   Influenza Whole 05/22/2019   Influenza,inj,Quad PF,6+ Mos 02/07/2017, 03/24/2018   Pneumococcal Conjugate-13 12/11/2019   Pneumococcal Polysaccharide-23 09/29/2020   Tdap 03/24/2018    TDAP status: Up to date  Flu Vaccine status: Due, Education has been provided regarding the importance of this vaccine. Advised may receive this vaccine at local pharmacy or Health Dept. Aware to provide a copy of the vaccination record if obtained from local pharmacy or Health Dept. Verbalized acceptance and understanding.  Pneumococcal vaccine status: Up to date  Covid-19 vaccine status: Declined, Education has been provided regarding the importance of this vaccine but patient still declined. Advised may receive this vaccine at local pharmacy or Health Dept.or vaccine clinic. Aware to provide a copy of the vaccination record if obtained from local pharmacy or Health Dept. Verbalized acceptance and understanding.  Qualifies for Shingles Vaccine? Yes  Zostavax completed No   Shingrix Completed?: No.    Education has been provided regarding the importance of this vaccine. Patient has been advised to call insurance company to determine out of pocket expense if they have not yet received this vaccine. Advised may also receive vaccine at local pharmacy or Health Dept. Verbalized acceptance and understanding.  Screening Tests Health Maintenance  Topic Date Due    COVID-19 Vaccine (1) 10/21/2021 (Originally 08/07/1956)   Zoster Vaccines- Shingrix (1 of 2) 05/18/2022 (Originally 02/07/2004)   INFLUENZA VACCINE  12/19/2021   COLONOSCOPY (Pts 45-26yr Insurance coverage will need to be confirmed)  10/06/2026   TETANUS/TDAP  03/24/2028   Pneumonia Vaccine 68 Years old  Completed   Hepatitis C Screening  Completed   HPV VACCINES  Aged Out    Health Maintenance  There are no preventive care reminders to display for this patient.   Colorectal cancer screening: Type of screening: Colonoscopy. Completed 10/06/2019. Repeat every 7 years  Lung Cancer Screening: (Low Dose CT Chest recommended if Age 68-80years, 30 pack-year currently smoking OR have quit w/in 15years.) does not qualify.    Additional Screening:  Hepatitis C Screening: does qualify; Completed 03/24/2018  Vision Screening: Recommended annual ophthalmology exams for early detection of glaucoma and other disorders of the eye. Is the patient up to date with their annual eye exam?  Yes  Who is the provider or what is the name of the [facility] which the patient attends annual [facility] exams[facility] a provider, would they like to be referred to a provider to establish care? No .   Dental Screening: Recommended annual dental exams for proper oral hygiene  Community Resource Referral / Chronic Care Management: CRR required this visit?  No   CCM required this visit?  No      Plan:     I have personally reviewed and noted the following in the patient's chart:   Medical and social history Use of alcohol, tobacco or illicit drugs  Current medications and supplements including opioid prescriptions. Patient is not currently taking opioid prescriptions. Functional ability and status Nutritional status Physical activity Advanced directives List of other physicians Hospitalizations, surgeries, and ER visits in previous 12 months Vitals Screenings to  include cognitive, depression, and falls Referrals and appointments  In addition, I have reviewed and discussed with patient certain preventive protocols, quality metrics, and best practice recommendations. A written personalized care plan for preventive services as well as general preventive health recommendations were provided to patient.     MChriss Driver LPN   [id number]  Nurse Notes: Discussed Shingrix and how to obtain.

## 2021-10-05 NOTE — Patient Instructions (Signed)
Daniel Rios , Thank you for taking time to come for your Medicare Wellness Visit. I appreciate your ongoing commitment to your health goals. Please review the following plan we discussed and let me know if I can assist you in the future.   Screening recommendations/referrals: Colonoscopy: Done 10/06/2019 Repeat every 7 years.  Recommended yearly ophthalmology/optometry visit for glaucoma screening and checkup Recommended yearly dental visit for hygiene and checkup  Vaccinations: Influenza vaccine: Due Fall 2023. Pneumococcal vaccine: Done 09/29/2020 and 12/11/19.  Tdap vaccine: Done 04/03/2018 Repeat in 10 years  Shingles vaccine: Discussed.   Covid-19: Declined.  Advanced directives: Please bring a copy of your health care power of attorney and living will to the office to be added to your chart at your convenience.   Conditions/risks identified: Aim for 30 minutes of exercise or brisk walking, 6-8 glasses of water, and 5 servings of fruits and vegetables each day.   Next appointment: Follow up in one year for your annual wellness visit. 2024.  Preventive Care 68 Years and Older, Male  Preventive care refers to lifestyle choices and visits with your health care provider that can promote health and wellness. What does preventive care include? A yearly physical exam. This is also called an annual well check. Dental exams once or twice a year. Routine eye exams. Ask your health care provider how often you should have your eyes checked. Personal lifestyle choices, including: Daily care of your teeth and gums. Regular physical activity. Eating a healthy diet. Avoiding tobacco and drug use. Limiting alcohol use. Practicing safe sex. Taking low doses of aspirin every day. Taking vitamin and mineral supplements as recommended by your health care provider. What happens during an annual well check? The services and screenings done by your health care provider during your annual well check  will depend on your age, overall health, lifestyle risk factors, and family history of disease. Counseling  Your health care provider may ask you questions about your: Alcohol use. Tobacco use. Drug use. Emotional well-being. Home and relationship well-being. Sexual activity. Eating habits. History of falls. Memory and ability to understand (cognition). Work and work Statistician. Screening  You may have the following tests or measurements: Height, weight, and BMI. Blood pressure. Lipid and cholesterol levels. These may be checked every 5 years, or more frequently if you are over 68 years old old. Skin check. Lung cancer screening. You may have this screening every year starting at age 68 if you have a 30-pack-year history of smoking and currently smoke or have quit within the past 15 years. Fecal occult blood test (FOBT) of the stool. You may have this test every year starting at age 68. Flexible sigmoidoscopy or colonoscopy. You may have a sigmoidoscopy every 5 years or a colonoscopy every 10 years starting at age 68. Prostate cancer screening. Recommendations will vary depending on your family history and other risks. Hepatitis C blood test. Hepatitis B blood test. Sexually transmitted disease (STD) testing. Diabetes screening. This is done by checking your blood sugar (glucose) after you have not eaten for a while (fasting). You may have this done every 1-3 years. Abdominal aortic aneurysm (AAA) screening. You may need this if you are a current or former smoker. Osteoporosis. You may be screened starting at age 68 if you are at high risk. Talk with your health care provider about your test results, treatment options, and if necessary, the need for more tests. Vaccines  Your health care provider may recommend certain vaccines, such as: Influenza  vaccine. This is recommended every year. Tetanus, diphtheria, and acellular pertussis (Tdap, Td) vaccine. You may need a Td booster every 10  years. Zoster vaccine. You may need this after age 68. Pneumococcal 13-valent conjugate (PCV13) vaccine. One dose is recommended after age 68. Pneumococcal polysaccharide (PPSV23) vaccine. One dose is recommended after age 68. Talk to your health care provider about which screenings and vaccines you need and how often you need them. This information is not intended to replace advice given to you by your health care provider. Make sure you discuss any questions you have with your health care provider. Document Released: 06/03/2015 Document Revised: 01/25/2016 Document Reviewed: 03/08/2015 Elsevier Interactive Patient Education  2017 [place] Prevention in the Home Falls can cause injuries. They can happen to people of all ages. There are many things you can do to make your home safe and to help prevent falls. What can I do on the outside of my home? Regularly fix the edges of walkways and driveways and fix any cracks. Remove anything that might make you trip as you walk through a door, such as a raised step or threshold. Trim any bushes or trees on the path to your home. Use bright outdoor lighting. Clear any walking paths of anything that might make someone trip, such as rocks or tools. Regularly check to see if handrails are loose or broken. Make sure that both sides of any steps have handrails. Any raised decks and porches should have guardrails on the edges. Have any leaves, snow, or ice cleared regularly. Use sand or salt on walking paths during winter. Clean up any spills in your garage right away. This includes oil or grease spills. What can I do in the bathroom? Use night lights. Install grab bars by the toilet and in the tub and shower. Do not use towel bars as grab bars. Use non-skid mats or decals in the tub or shower. If you need to sit down in the shower, use a plastic, non-slip stool. Keep the floor dry. Clean up any water that spills on the floor as soon as it  happens. Remove soap buildup in the tub or shower regularly. Attach bath mats securely with double-sided non-slip rug tape. Do not have throw rugs and other things on the floor that can make you trip. What can I do in the bedroom? Use night lights. Make sure that you have a light by your bed that is easy to reach. Do not use any sheets or blankets that are too big for your bed. They should not hang down onto the floor. Have a firm chair that has side arms. You can use this for support while you get dressed. Do not have throw rugs and other things on the floor that can make you trip. What can I do in the kitchen? Clean up any spills right away. Avoid walking on wet floors. Keep items that you use a lot in easy-to-reach places. If you need to reach something above you, use a strong step stool that has a grab bar. Keep electrical cords out of the way. Do not use floor polish or wax that makes floors slippery. If you must use wax, use non-skid floor wax. Do not have throw rugs and other things on the floor that can make you trip. What can I do with my stairs? Do not leave any items on the stairs. Make sure that there are handrails on both sides of the stairs and use them. Fix  handrails that are broken or loose. Make sure that handrails are as long as the stairways. Check any carpeting to make sure that it is firmly attached to the stairs. Fix any carpet that is loose or worn. Avoid having throw rugs at the top or bottom of the stairs. If you do have throw rugs, attach them to the floor with carpet tape. Make sure that you have a light switch at the top of the stairs and the bottom of the stairs. If you do not have them, ask someone to add them for you. What else can I do to help prevent falls? Wear shoes that: Do not have high heels. Have rubber bottoms. Are comfortable and fit you well. Are closed at the toe. Do not wear sandals. If you use a stepladder: Make sure that it is fully opened.  Do not climb a closed stepladder. Make sure that both sides of the stepladder are locked into place. Ask someone to hold it for you, if possible. Clearly mark and make sure that you can see: Any grab bars or handrails. First and last steps. Where the edge of each step is. Use tools that help you move around (mobility aids) if they are needed. These include: Canes. Walkers. Scooters. Crutches. Turn on the lights when you go into a dark area. Replace any light bulbs as soon as they burn out. Set up your furniture so you have a clear path. Avoid moving your furniture around. If any of your floors are uneven, fix them. If there are any pets around you, be aware of where they are. Review your medicines with your doctor. Some medicines can make you feel dizzy. This can increase your chance of falling. Ask your doctor what other things that you can do to help prevent falls. This information is not intended to replace advice given to you by your health care provider. Make sure you discuss any questions you have with your health care provider. Document Released: 03/03/2009 Document Revised: 10/13/2015 Document Reviewed: 06/11/2014 Elsevier Interactive Patient Education  2017 [employer].

## 2021-11-12 ENCOUNTER — Other Ambulatory Visit: Payer: Self-pay | Admitting: Family Medicine

## 2021-11-12 DIAGNOSIS — E7800 Pure hypercholesterolemia, unspecified: Secondary | ICD-10-CM

## 2021-12-26 DIAGNOSIS — L570 Actinic keratosis: Secondary | ICD-10-CM | POA: Diagnosis not present

## 2021-12-26 DIAGNOSIS — D225 Melanocytic nevi of trunk: Secondary | ICD-10-CM | POA: Diagnosis not present

## 2021-12-26 DIAGNOSIS — D2271 Melanocytic nevi of right lower limb, including hip: Secondary | ICD-10-CM | POA: Diagnosis not present

## 2021-12-26 DIAGNOSIS — D2262 Melanocytic nevi of left upper limb, including shoulder: Secondary | ICD-10-CM | POA: Diagnosis not present

## 2021-12-26 DIAGNOSIS — X32XXXA Exposure to sunlight, initial encounter: Secondary | ICD-10-CM | POA: Diagnosis not present

## 2021-12-26 DIAGNOSIS — D2272 Melanocytic nevi of left lower limb, including hip: Secondary | ICD-10-CM | POA: Diagnosis not present

## 2021-12-26 DIAGNOSIS — Z85828 Personal history of other malignant neoplasm of skin: Secondary | ICD-10-CM | POA: Diagnosis not present

## 2022-01-05 ENCOUNTER — Ambulatory Visit: Payer: PPO | Admitting: Family Medicine

## 2022-01-08 ENCOUNTER — Ambulatory Visit (INDEPENDENT_AMBULATORY_CARE_PROVIDER_SITE_OTHER): Payer: PPO | Admitting: Family Medicine

## 2022-01-08 VITALS — BP 116/62 | HR 70 | Ht 72.0 in | Wt 184.2 lb

## 2022-01-08 DIAGNOSIS — G629 Polyneuropathy, unspecified: Secondary | ICD-10-CM

## 2022-01-08 DIAGNOSIS — E161 Other hypoglycemia: Secondary | ICD-10-CM | POA: Diagnosis not present

## 2022-01-08 MED ORDER — GABAPENTIN 300 MG PO CAPS: 300.0000 mg | ORAL_CAPSULE | Freq: Three times a day (TID) | ORAL | 1 refills | Status: DC | PRN

## 2022-01-08 NOTE — Progress Notes (Signed)
Subjective:    Patient ID: Daniel Rios, male    DOB: May 14, 1956, 66 y.o.   MRN: [id number]  HPI  Patient states that he has unusual hyperesthesias and paresthesias on the plantar aspect of his right foot just distal to the insertion of the plantar fascia on the sole of his foot.  This occurs primarily at night.  He states it feels like a tingling sensation or a pinching sensation.  He states it feels like something is crawling on his skin.  However there is no visible abnormality in that area.  There is no tenderness to palpation.  There is no erythema.  He has normal dorsalis pedis and posterior tibialis pulses bilaterally.  He has normal sensation to 10 g monofilament. Past Medical History:  Diagnosis Date   Allergy    Hyperlipidemia    Hypertension    Strabismus    Past Surgical History:  Procedure Laterality Date   COLONOSCOPY  11-01-2003   normal   COLONOSCOPY  06/21/2014   1-TA   FOOT SURGERY Right 2019   Current Outpatient Medications on File Prior to Visit  Medication Sig Dispense Refill   amLODipine (NORVASC) 5 MG tablet TAKE 1 TABLET(5 MG) BY MOUTH DAILY 90 tablet 3   losartan (COZAAR) 100 MG tablet TAKE 1 TABLET(100 MG) BY MOUTH DAILY 90 tablet 3   Multiple Vitamin (MULTIVITAMIN) tablet Take 1 tablet by mouth daily. One a day 50+     Omega-3 Fatty Acids (FISH OIL) 1360 MG CAPS Take by mouth.     rosuvastatin (CRESTOR) 20 MG tablet TAKE 1 TABLET(20 MG) BY MOUTH DAILY 90 tablet 0   tamsulosin (FLOMAX) 0.4 MG CAPS capsule TAKE 1 CAPSULE BY MOUTH EVERY NIGHT AT BEDTIME 90 capsule 0   No current facility-administered medications on file prior to visit.     Allergies  Allergen Reactions   Celebrex [Celecoxib] Swelling and Rash   Social History   Socioeconomic History   Marital status: Married    Spouse name: Not on file   Number of children: Not on file   Years of education: Not on file   Highest education level: Not on file  Occupational History   Not on file   Tobacco Use   Smoking status: Former    Packs/day: 0.00    Years: 2.00    Total pack years: 0.00    Types: Cigarettes    Quit date: 09/22/1982    Years since quitting: 39.3   Smokeless tobacco: Never  Vaping Use   Vaping Use: Never used  Substance and Sexual Activity   Alcohol use: Yes   Drug use: No   Sexual activity: Not on file    Comment: married, quit smoking in 1982.    Other Topics Concern   Not on file  Social History Narrative   Not on file   Social Determinants of Health   Financial Resource Strain: Low Risk  (10/05/2021)   Overall Financial Resource Strain (CARDIA)    Difficulty of Paying Living Expenses: Not hard at all  Food Insecurity: No Food Insecurity (10/05/2021)   Hunger Vital Sign    Worried About Running Out of Food in the Last Year: Never true    Ran Out of Food in the Last Year: Never true  Transportation Needs: No Transportation Needs (10/05/2021)   PRAPARE - Hydrologist (Medical): No    Lack of Transportation (Non-Medical): No  Physical Activity: Sufficiently Active (10/05/2021)  Exercise Vital Sign    Days of Exercise per Week: 5 days    Minutes of Exercise per Session: 30 min  Stress: No Stress Concern Present (10/05/2021)   [place]    Feeling of Stress : Not at all  Social Connections: [place] (10/05/2021)   Social Connection and Isolation Panel [NHANES]    Frequency of Communication with Friends and Family: More than three times a week    Frequency of Social Gatherings with Friends and Family: More than three times a week    Attends Religious Services: 1 to 4 times per year    Active Member of [employer] or Organizations: Yes    Attends Archivist Meetings: More than 4 times per year    Marital Status: Married  Human resources officer Violence: Not At Risk (10/05/2021)   Humiliation, Afraid, Rape, and Kick questionnaire    Fear of  Current or Ex-Partner: No    Emotionally Abused: No    Physically Abused: No    Sexually Abused: No     Review of Systems  All other systems reviewed and are negative.      Objective:   Physical Exam Vitals reviewed.  Constitutional:      General: He is not in acute distress.    Appearance: He is well-developed. He is not diaphoretic.  HENT:     Head: Normocephalic and atraumatic.  Eyes:     General: No scleral icterus. Neck:     Thyroid: No thyromegaly.     Vascular: No JVD.     Trachea: No tracheal deviation.  Cardiovascular:     Rate and Rhythm: Normal rate and regular rhythm.     Heart sounds: Normal heart sounds. No murmur heard.    No friction rub. No gallop.  Pulmonary:     Effort: Pulmonary effort is normal. No respiratory distress.     Breath sounds: Normal breath sounds. No stridor. No wheezing or rales.  Chest:     Chest wall: No tenderness.  Musculoskeletal:        General: No tenderness.     Right lower leg: No edema.     Left lower leg: No edema.       Feet:  Skin:    General: Skin is warm.     Coloration: Skin is not pale.     Findings: No erythema or rash.  Neurological:     Mental Status: He is alert and oriented to person, place, and time.     Cranial Nerves: No cranial nerve deficit.     Motor: No abnormal muscle tone.     Coordination: Coordination normal.     Deep Tendon Reflexes: Reflexes are normal and symmetric.  Psychiatric:        Behavior: Behavior normal.        Thought Content: Thought content normal.        Judgment: Judgment normal.           Assessment & Plan:  Neuropathy - Plan: Hemoglobin A1c, Vitamin B12 Patient appears to have neuropathy in his right foot characterized by hyperesthesias and paresthesias.  I will check a hemoglobin A1c to evaluate for diabetes as well as a B12 level.  I will treat the patient symptomatically with gabapentin 300 mg p.o. nightly.  There is no visible deformity in the area where the  patient has a hyperesthesias.  There is no palpable abnormality.

## 2022-01-09 LAB — HEMOGLOBIN A1C
Hgb A1c MFr Bld: 5.3 % of total Hgb (ref <5.7)
Mean Plasma Glucose: 105 mg/dL
eAG (mmol/L): 5.8 mmol/L

## 2022-01-09 LAB — VITAMIN B12: Vitamin B-12: 618 pg/mL (ref 200–1100)

## 2022-01-16 ENCOUNTER — Other Ambulatory Visit: Payer: Self-pay | Admitting: *Deleted

## 2022-01-16 NOTE — Telephone Encounter (Unsigned)
This encounter was created in error - please disregard.  This encounter was created in error - please disregard.

## 2022-01-16 NOTE — Patient Outreach (Addendum)
[place] [facility]) Care Management  01/16/2022  KEIONDRE COLEE 05-04-1956 [id number]  Nurse opened chart in error.  Emelia Loron RN, BSN [place] [phone] [email or web address]

## 2022-02-09 ENCOUNTER — Other Ambulatory Visit: Payer: Self-pay | Admitting: Family Medicine

## 2022-02-09 DIAGNOSIS — E7800 Pure hypercholesterolemia, unspecified: Secondary | ICD-10-CM

## 2022-02-09 NOTE — Telephone Encounter (Signed)
Requested Prescriptions  Pending Prescriptions Disp Refills  . rosuvastatin (CRESTOR) 20 MG tablet [Pharmacy Med Name: ROSUVASTATIN '20MG'$  TABLETS] 90 tablet 0    Sig: TAKE 1 TABLET(20 MG) BY MOUTH DAILY     Cardiovascular:  Antilipid - Statins 2 Failed - 02/09/2022  4:05 AM      Failed - Lipid Panel in normal range within the last 12 months    Cholesterol  Date Value Ref Range Status  04/17/2021 178 <200 mg/dL Final   LDL Cholesterol (Calc)  Date Value Ref Range Status  04/17/2021 109 (H) mg/dL (calc) Final    Comment:    Reference range: <100 . Desirable range <100 mg/dL for primary prevention;   <70 mg/dL for patients with CHD or diabetic patients  with > or = 2 CHD risk factors. Marland Kitchen LDL-C is now calculated using the Martin-Hopkins  calculation, which is a validated novel method providing  better accuracy than the Friedewald equation in the  estimation of LDL-C.  Cresenciano Genre et al. Annamaria Helling. 7510;258(52): 2061-2068  (http://education.[facility].com/faq/FAQ164)    HDL  Date Value Ref Range Status  04/17/2021 54 > OR = 40 mg/dL Final   Triglycerides  Date Value Ref Range Status  04/17/2021 68 <150 mg/dL Final         Passed - Cr in normal range and within 360 days    Creat  Date Value Ref Range Status  04/17/2021 1.16 0.70 - 1.35 mg/dL Final   Creatinine, Urine  Date Value Ref Range Status  05/22/2020 87.05 mg/dL Final         Passed - Patient is not pregnant      Passed - Valid encounter within last 12 months    Recent Outpatient Visits          9 months ago Benign essential HTN   [place] Pickard, Cammie Mcgee, MD   1 year ago General medical exam   [place] Susy Frizzle, MD   1 year ago Bacterial sinusitis   [place] Eulogio Bear, NP   1 year ago [place] Dennard Schaumann, Cammie Mcgee, MD   1 year ago [place] Dennard Schaumann, Cammie Mcgee, MD              . losartan (COZAAR) 100 MG tablet [Pharmacy Med Name: LOSARTAN '100MG'$  TABLETS] 90 tablet 0    Sig: TAKE 1 TABLET(100 MG) BY MOUTH DAILY     Cardiovascular:  Angiotensin Receptor Blockers Failed - 02/09/2022  4:05 AM      Failed - Cr in normal range and within 180 days    Creat  Date Value Ref Range Status  04/17/2021 1.16 0.70 - 1.35 mg/dL Final   Creatinine, Urine  Date Value Ref Range Status  05/22/2020 87.05 mg/dL Final         Failed - K in normal range and within 180 days    Potassium  Date Value Ref Range Status  04/17/2021 5.4 (H) 3.5 - 5.3 mmol/L Final         Failed - Valid encounter within last 6 months    Recent Outpatient Visits          9 months ago Benign essential HTN   [place], Cammie Mcgee, MD   1 year ago General medical exam   [place], Warren T, MD   1 year ago Bacterial sinusitis  [place] Eulogio Bear, NP   1 year ago [place] Pickard, Cammie Mcgee, MD   1 year ago [place] Pickard, Cammie Mcgee, MD             Passed - Patient is not pregnant      Passed - Last BP in normal range    BP Readings from Last 1 Encounters:  01/08/22 116/62         . tamsulosin (FLOMAX) 0.4 MG CAPS capsule [Pharmacy Med Name: TAMSULOSIN 0.'4MG'$  CAPSULES] 90 capsule 0    Sig: TAKE 1 CAPSULE BY MOUTH EVERY NIGHT AT BEDTIME     Urology: Alpha-Adrenergic Blocker Failed - 02/09/2022  4:05 AM      Failed - PSA in normal range and within 360 days    PSA  Date Value Ref Range Status  09/29/2020 1.65 < OR = 4.00 ng/mL Final    Comment:    The total PSA value from this assay system is  standardized against the WHO standard. The test  result will be approximately 20% lower when compared  to the equimolar-standardized total PSA (Beckman  Coulter). Comparison of serial PSA results should be  interpreted with this fact in  mind. . This test was performed using the Siemens  chemiluminescent method. Values obtained from  different assay methods cannot be used interchangeably. PSA levels, regardless of value, should not be interpreted as absolute evidence of the presence or absence of disease.          Passed - Last BP in normal range    BP Readings from Last 1 Encounters:  01/08/22 116/62         Passed - Valid encounter within last 12 months    Recent Outpatient Visits          9 months ago Benign essential HTN   [place] Pickard, Cammie Mcgee, MD   1 year ago General medical exam   [place] [facility] Susy Frizzle, MD   1 year ago Bacterial sinusitis   [place] Eulogio Bear, NP   1 year ago [place] Pickard, Cammie Mcgee, MD   1 year ago [place] Pickard, Cammie Mcgee, MD

## 2022-03-02 ENCOUNTER — Other Ambulatory Visit: Payer: Self-pay

## 2022-03-02 ENCOUNTER — Ambulatory Visit (INDEPENDENT_AMBULATORY_CARE_PROVIDER_SITE_OTHER): Payer: PPO

## 2022-03-02 ENCOUNTER — Ambulatory Visit
Admission: RE | Admit: 2022-03-02 | Discharge: 2022-03-02 | Disposition: A | Discharge status: Home / Self Care | Payer: PPO | Source: Ambulatory Visit | Attending: Nurse Practitioner | Admitting: Nurse Practitioner

## 2022-03-02 VITALS — BP 163/82 | HR 84 | Temp 98.4°F | Resp 20

## 2022-03-02 DIAGNOSIS — R059 Cough, unspecified: Secondary | ICD-10-CM

## 2022-03-02 DIAGNOSIS — R0981 Nasal congestion: Secondary | ICD-10-CM | POA: Diagnosis not present

## 2022-03-02 MED ORDER — FLUTICASONE PROPIONATE 50 MCG/ACT NA SUSP: 2.0000 | Freq: Every day | NASAL | 0 refills | Status: DC

## 2022-03-02 MED ORDER — PROMETHAZINE-DM 6.25-15 MG/5ML PO SYRP: 5.0000 mL | ORAL_SOLUTION | Freq: Four times a day (QID) | ORAL | 0 refills | Status: DC | PRN

## 2022-03-02 MED ORDER — PREDNISONE 20 MG PO TABS: 40.0000 mg | ORAL_TABLET | Freq: Every day | ORAL | 0 refills | Status: AC

## 2022-03-02 NOTE — Discharge Instructions (Addendum)
X-rays do not show pneumonia.  There appears to be some scarring noted in the lungs that may be contributing to your symptoms. Take medication as prescribed. Increase fluids. Recommend using a humidifier in the bedroom during sleep to help with cough. May help to sleep elevated on 2 pillows while cough symptoms persist. Because your symptoms continue to recur, please consider following up with your primary care physician or pulmonologist for further evaluation. Go to the emergency department immediately if you develop shortness of breath, difficulty breathing, inability to speak in a complete sentence, or other concerns. Follow-up with your primary care physician if symptoms fail to improve.

## 2022-03-02 NOTE — ED Triage Notes (Signed)
Pt reports intermittent cough since august. Pt reports history of similar every year. Pt reports has tried otc medication and reports cough gets better then comes back.

## 2022-03-02 NOTE — ED Provider Notes (Signed)
[facility]    CSN: [id number] Arrival date & time: 03/02/22  1151      History   Chief Complaint Chief Complaint  Patient presents with   Cough    Entered by patient    HPI Daniel Rios is a 66 y.o. male.   The history is provided by the patient.   Patient presents for complaints of a 14-monthhistory of cough and wheezing.  Patient reports he has a history of the same and that he gets this "every year.  Patient states that the cough is intermittently productive.  Has a feeling of chest congestion but no chest pain or shortness of breath.  He also states that his throat feels "raw".  Usually has an illness like this including bronchitis once a year.  No history of asthma.  Patient states that he smoked, but he quit "50 years ago".  States he has been using over-the-counter medications for his symptoms with minimal relief.  Past Medical History:  Diagnosis Date   Allergy    Hyperlipidemia    Hypertension    Strabismus     Patient Active Problem List   Diagnosis Date Noted   Strabismus    Hyperlipidemia    Hypertension     Past Surgical History:  Procedure Laterality Date   COLONOSCOPY  11-01-2003   normal   COLONOSCOPY  06/21/2014   1-TA   FOOT SURGERY Right 2019       Home Medications    Prior to Admission medications   Medication Sig Start Date End Date Taking? Authorizing Provider  fluticasone (FLONASE) 50 MCG/ACT nasal spray Place 2 sprays into both nostrils daily. 03/02/22  Yes Cyenna Rebello-Warren, CAlda Lea NP  predniSONE (DELTASONE) 20 MG tablet Take 2 tablets (40 mg total) by mouth daily with breakfast for 5 days. 03/02/22 03/07/22 Yes Tivon Lemoine-Warren, CAlda Lea NP  promethazine-dextromethorphan (PROMETHAZINE-DM) 6.25-15 MG/5ML syrup Take 5 mLs by mouth 4 (four) times daily as needed for cough. 03/02/22  Yes Arnett Duddy-Warren, CAlda Lea NP  amLODipine (NORVASC) 5 MG tablet TAKE 1 TABLET(5 MG) BY MOUTH DAILY 04/17/21   PSusy Frizzle MD   gabapentin (NEURONTIN) 300 MG capsule Take 1 capsule (300 mg total) by mouth 3 (three) times daily as needed (nerve pain). 01/08/22   PSusy Frizzle MD  losartan (COZAAR) 100 MG tablet TAKE 1 TABLET(100 MG) BY MOUTH DAILY 02/09/22   PSusy Frizzle MD  Multiple Vitamin (MULTIVITAMIN) tablet Take 1 tablet by mouth daily. One a day 50+    [provider]  Omega-3 Fatty Acids (FISH OIL) 1360 MG CAPS Take by mouth.    [provider]  rosuvastatin (CRESTOR) 20 MG tablet TAKE 1 TABLET(20 MG) BY MOUTH DAILY 02/09/22   PSusy Frizzle MD  tamsulosin (FLOMAX) 0.4 MG CAPS capsule TAKE 1 CAPSULE BY MOUTH EVERY NIGHT AT BEDTIME 02/09/22   PSusy Frizzle MD    Family History Family History  Problem Relation Age of Onset   Arthritis Mother    Cancer Father        had metas.all over when found-? starting point    COPD Brother    Colon cancer Maternal Aunt    Colon polyps Neg Hx    Esophageal cancer Neg Hx    Rectal cancer Neg Hx    Stomach cancer Neg Hx     Social History Social History   Tobacco Use   Smoking status: Former    Packs/day: 0.00  Years: 2.00    Total pack years: 0.00    Types: Cigarettes    Quit date: 09/22/1982    Years since quitting: 39.4   Smokeless tobacco: Never  Vaping Use   Vaping Use: Never used  Substance Use Topics   Alcohol use: Yes   Drug use: No     Allergies   Celebrex [celecoxib]   Review of Systems Review of Systems Per HPI  Physical Exam Triage Vital Signs ED Triage Vitals [03/02/22 1240]  Enc Vitals Group     BP (!) 163/82     Pulse Rate 84     Resp 20     Temp 98.4 F (36.9 C)     Temp Source Oral     SpO2 98 %     Weight      Height      Head Circumference      Peak Flow      Pain Score 0     Pain Loc      Pain Edu?      Excl. in [place]?    No data found.  Updated Vital Signs BP (!) 163/82 (BP Location: Right Arm)   Pulse 84   Temp 98.4 F (36.9 C) (Oral)   Resp 20   SpO2 98%   Visual  Acuity Right Eye Distance:   Left Eye Distance:   Bilateral Distance:    Right Eye Near:   Left Eye Near:    Bilateral Near:     Physical Exam Vitals and nursing note reviewed.  Constitutional:      General: He is not in acute distress.    Appearance: Normal appearance.  HENT:     Head: Normocephalic.     Right Ear: Tympanic membrane, ear canal and external ear normal.     Left Ear: Tympanic membrane, ear canal and external ear normal.     Nose: Congestion present.     Mouth/Throat:     Mouth: Mucous membranes are moist.     Pharynx: Posterior oropharyngeal erythema present.  Eyes:     Extraocular Movements: Extraocular movements intact.     Conjunctiva/sclera: Conjunctivae normal.     Pupils: Pupils are equal, round, and reactive to light.  Cardiovascular:     Rate and Rhythm: Normal rate and regular rhythm.     Pulses: Normal pulses.     Heart sounds: Normal heart sounds.  Pulmonary:     Effort: Pulmonary effort is normal. No respiratory distress.     Breath sounds: Normal breath sounds. No stridor. No wheezing, rhonchi or rales.  Abdominal:     General: Bowel sounds are normal.     Palpations: Abdomen is soft.  Musculoskeletal:     Cervical back: Normal range of motion.  Lymphadenopathy:     Cervical: No cervical adenopathy.  Skin:    General: Skin is warm and dry.  Neurological:     General: No focal deficit present.     Mental Status: He is alert and oriented to person, place, and time.  Psychiatric:        Mood and Affect: Mood normal.        Behavior: Behavior normal.      UC Treatments / Results  Labs (all labs ordered are listed, but only abnormal results are displayed) Labs Reviewed - No data to display  EKG   Radiology DG Chest 2 View  Result Date: 03/02/2022 CLINICAL DATA:  Cough. EXAM: CHEST - 2 VIEW COMPARISON:  Chest x-ray August 09, 2021. FINDINGS: Unchanged linear opacities in lung, favored represent atelectasis/scar. No new  consolidation. No visible pleural effusions or pneumothorax. Cardiomediastinal silhouette is within normal limits. No acute osseous abnormality. IMPRESSION: No evidence of active cardiopulmonary disease. Similar areas of probable atelectasis/scar. Electronically Signed   By: Margaretha Sheffield M.D.   On: 03/02/2022 13:12    Procedures Procedures (including critical care time)  Medications Ordered in UC Medications - No data to display  Initial Impression / Assessment and Plan / UC Course  I have reviewed the triage vital signs and the nursing notes.  Pertinent labs & imaging results that were available during my care of the patient were reviewed by me and considered in my medical decision making (see chart for details).  Patient presents for a 68-monthhistory of cough and nasal congestion.  Patient is well-appearing, is in no acute distress, vital signs are stable.  Chest x-ray was performed which did not show any cardiopulmonary involvement, but did show possible scarring.  We will treat patient's cough with prednisone 40 mg for 5 days and Promethazine DM cough syrup.  For his nasal congestion, will provide a prescription for fluticasone nasal spray.  Patient was advised that because his symptoms continue to persist or are recurrent, recommend that he follow-up with her primary care physician or with pulmonology for further evaluation.  Supportive care recommendations were provided to the patient to include increasing fluids, use of a humidifier, and sleeping elevated on pillows during sleep.  Patient was given strict indications of when to go to the emergency department.  Patient verbalizes understanding.  All questions were answered.  Patient stable for discharge. Final Clinical Impressions(s) / UC Diagnoses   Final diagnoses:  Cough, unspecified type  Nasal congestion     Discharge Instructions      X-rays do not show pneumonia.  There appears to be some scarring noted in the lungs that  may be contributing to your symptoms. Take medication as prescribed. Increase fluids. Recommend using a humidifier in the bedroom during sleep to help with cough. May help to sleep elevated on 2 pillows while cough symptoms persist. Because your symptoms continue to recur, please consider following up with your primary care physician or pulmonologist for further evaluation. Go to the emergency department immediately if you develop shortness of breath, difficulty breathing, inability to speak in a complete sentence, or other concerns. Follow-up with your primary care physician if symptoms fail to improve.      ED Prescriptions     Medication Sig Dispense Auth. Provider   fluticasone (FLONASE) 50 MCG/ACT nasal spray Place 2 sprays into both nostrils daily. 16 g Lakynn Halvorsen-Warren, CAlda Lea NP   predniSONE (DELTASONE) 20 MG tablet Take 2 tablets (40 mg total) by mouth daily with breakfast for 5 days. 10 tablet Orie Baxendale-Warren, CAlda Lea NP   promethazine-dextromethorphan (PROMETHAZINE-DM) 6.25-15 MG/5ML syrup Take 5 mLs by mouth 4 (four) times daily as needed for cough. 140 mL Rodricus Candelaria-Warren, CAlda Lea NP      PDMP not reviewed this encounter.   LTish Men NP 03/02/22 1334

## 2022-05-11 ENCOUNTER — Other Ambulatory Visit: Payer: Self-pay | Admitting: Family Medicine

## 2022-05-11 DIAGNOSIS — E7800 Pure hypercholesterolemia, unspecified: Secondary | ICD-10-CM

## 2022-05-11 NOTE — Telephone Encounter (Signed)
PT NEED CPE APPT W/PCP FOR FUTURE REFILLS  

## 2022-05-24 NOTE — Telephone Encounter (Signed)
Appt scheduled for next week.

## 2022-05-25 ENCOUNTER — Other Ambulatory Visit: Payer: Self-pay | Admitting: Family Medicine

## 2022-05-25 ENCOUNTER — Other Ambulatory Visit: Payer: Self-pay

## 2022-05-25 DIAGNOSIS — I10 Essential (primary) hypertension: Secondary | ICD-10-CM

## 2022-05-25 MED ORDER — LOSARTAN POTASSIUM 100 MG PO TABS: ORAL_TABLET | ORAL | 1 refills | Status: DC

## 2022-05-25 MED ORDER — AMLODIPINE BESYLATE 5 MG PO TABS: ORAL_TABLET | ORAL | 1 refills | Status: DC

## 2022-05-29 ENCOUNTER — Ambulatory Visit (INDEPENDENT_AMBULATORY_CARE_PROVIDER_SITE_OTHER): Payer: PPO | Admitting: Family Medicine

## 2022-05-29 ENCOUNTER — Encounter: Payer: Self-pay | Admitting: Family Medicine

## 2022-05-29 VITALS — BP 132/80 | HR 88 | Temp 98.2°F | Ht 72.0 in | Wt 186.4 lb

## 2022-05-29 DIAGNOSIS — Z125 Encounter for screening for malignant neoplasm of prostate: Secondary | ICD-10-CM

## 2022-05-29 DIAGNOSIS — I10 Essential (primary) hypertension: Secondary | ICD-10-CM

## 2022-05-29 MED ORDER — AMOXICILLIN 875 MG PO TABS: 875.0000 mg | ORAL_TABLET | Freq: Two times a day (BID) | ORAL | 0 refills | Status: AC

## 2022-05-29 NOTE — Progress Notes (Signed)
Subjective:    Patient ID: Daniel Rios, male    DOB: 04-Apr-1956, 67 y.o.   MRN: [id number]  HPI  Patient is a very pleasant 67 year old Caucasian gentleman who presents today for follow-up of his chronic medical problems.  He has a history of hypertension however his blood pressure today is well-controlled.  He denies any chest pain shortness of breath or dyspnea on exertion.  He also has a history of hyperlipidemia but he is consistently taking his statin.  He is overdue to check his PSA.  He is due for the shingles vaccine as well as a flu shot and a COVID shot however he politely declines the flu shot and the COVID shot today.  He has been battling sinusitis for about 2 weeks and is starting to have some pain in his right maxillary sinus.  I still feel that is most likely a virus however he does have a history of sinus infections in the past Past Medical History:  Diagnosis Date   Allergy    Hyperlipidemia    Hypertension    Strabismus    Past Surgical History:  Procedure Laterality Date   COLONOSCOPY  11-01-2003   normal   COLONOSCOPY  06/21/2014   1-TA   FOOT SURGERY Right 2019   Current Outpatient Medications on File Prior to Visit  Medication Sig Dispense Refill   amLODipine (NORVASC) 5 MG tablet TAKE 1 TABLET(5 MG) BY MOUTH DAILY 90 tablet 1   losartan (COZAAR) 100 MG tablet TAKE 1 TABLET(100 MG) BY MOUTH DAILY 90 tablet 0   Multiple Vitamin (MULTIVITAMIN) tablet Take 1 tablet by mouth daily. One a day 50+     Omega-3 Fatty Acids (FISH OIL) 1360 MG CAPS Take by mouth.     rosuvastatin (CRESTOR) 20 MG tablet TAKE 1 TABLET(20 MG) BY MOUTH DAILY 90 tablet 0   tamsulosin (FLOMAX) 0.4 MG CAPS capsule TAKE 1 CAPSULE BY MOUTH EVERY NIGHT AT BEDTIME 90 capsule 0   No current facility-administered medications on file prior to visit.     Allergies  Allergen Reactions   Celebrex [Celecoxib] Swelling and Rash   Social History   Socioeconomic History   Marital status: Married     Spouse name: Not on file   Number of children: Not on file   Years of education: Not on file   Highest education level: Not on file  Occupational History   Not on file  Tobacco Use   Smoking status: Former    Packs/day: 0.00    Years: 2.00    Total pack years: 0.00    Types: Cigarettes    Quit date: 09/22/1982    Years since quitting: 39.7   Smokeless tobacco: Never  Vaping Use   Vaping Use: Never used  Substance and Sexual Activity   Alcohol use: Yes   Drug use: No   Sexual activity: Not on file    Comment: married, quit smoking in 1982.    Other Topics Concern   Not on file  Social History Narrative   Not on file   Social Determinants of Health   Financial Resource Strain: Low Risk  (10/05/2021)   Overall Financial Resource Strain (CARDIA)    Difficulty of Paying Living Expenses: Not hard at all  Food Insecurity: No Food Insecurity (10/05/2021)   Hunger Vital Sign    Worried About Running Out of Food in the Last Year: Never true    Ran Out of Food in the Last Year: Never  true  Transportation Needs: No Transportation Needs (10/05/2021)   PRAPARE - Hydrologist (Medical): No    Lack of Transportation (Non-Medical): No  Physical Activity: Sufficiently Active (10/05/2021)   Exercise Vital Sign    Days of Exercise per Week: 5 days    Minutes of Exercise per Session: 30 min  Stress: No Stress Concern Present (10/05/2021)   [place]    Feeling of Stress : Not at all  Social Connections: [place] (10/05/2021)   Social Connection and Isolation Panel [NHANES]    Frequency of Communication with Friends and Family: More than three times a week    Frequency of Social Gatherings with Friends and Family: More than three times a week    Attends Religious Services: 1 to 4 times per year    Active Member of [employer] or Organizations: Yes    Attends Archivist Meetings:  More than 4 times per year    Marital Status: Married  Human resources officer Violence: Not At Risk (10/05/2021)   Humiliation, Afraid, Rape, and Kick questionnaire    Fear of Current or Ex-Partner: No    Emotionally Abused: No    Physically Abused: No    Sexually Abused: No     Review of Systems  All other systems reviewed and are negative.      Objective:   Physical Exam Vitals reviewed.  Constitutional:      General: He is not in acute distress.    Appearance: He is well-developed. He is not diaphoretic.  HENT:     Head: Normocephalic and atraumatic.     Right Ear: External ear normal.     Left Ear: External ear normal.     Nose: Nose normal.     Mouth/Throat:     Pharynx: No oropharyngeal exudate.  Eyes:     General: No scleral icterus.       Right eye: No discharge.        Left eye: No discharge.     Conjunctiva/sclera: Conjunctivae normal.     Pupils: Pupils are equal, round, and reactive to light.  Neck:     Thyroid: No thyromegaly.     Vascular: No JVD.     Trachea: No tracheal deviation.  Cardiovascular:     Rate and Rhythm: Normal rate and regular rhythm.     Heart sounds: Normal heart sounds. No murmur heard.    No friction rub. No gallop.  Pulmonary:     Effort: Pulmonary effort is normal. No respiratory distress.     Breath sounds: Normal breath sounds. No stridor. No wheezing or rales.  Chest:     Chest wall: No tenderness.  Abdominal:     General: Bowel sounds are normal. There is no distension.     Palpations: Abdomen is soft. There is no mass.     Tenderness: There is no abdominal tenderness. There is no guarding or rebound.  Musculoskeletal:        General: No tenderness. Normal range of motion.     Cervical back: Normal range of motion and neck supple.  Lymphadenopathy:     Cervical: No cervical adenopathy.  Skin:    General: Skin is warm.     Coloration: Skin is not pale.     Findings: No erythema or rash.  Neurological:     Mental Status: He  is alert and oriented to person, place, and time.  Cranial Nerves: No cranial nerve deficit.     Motor: No abnormal muscle tone.     Coordination: Coordination normal.     Deep Tendon Reflexes: Reflexes are normal and symmetric.  Psychiatric:        Behavior: Behavior normal.        Thought Content: Thought content normal.        Judgment: Judgment normal.           Assessment & Plan:  Benign essential HTN - Plan: CBC with Differential/Platelet, Lipid panel, COMPLETE METABOLIC PANEL WITH GFR  Prostate cancer screening - Plan: PSA Physical exam today is unremarkable.  Blood pressure standing.  Check CBC CMP and a lipid panel.  Check a PSA.  I gave the patient amoxicillin but asked him not to fill the prescription unless he develops fever or persistent pain in his maxillary sinus

## 2022-05-30 LAB — COMPLETE METABOLIC PANEL WITH GFR
AG Ratio: 1.6 (calc) (ref 1.0–2.5)
ALT: 33 U/L (ref 9–46)
AST: 28 U/L (ref 10–35)
Albumin: 4.5 g/dL (ref 3.6–5.1)
Alkaline phosphatase (APISO): 74 U/L (ref 35–144)
BUN: 16 mg/dL (ref 7–25)
CO2: 26 mmol/L (ref 20–32)
Calcium: 9.7 mg/dL (ref 8.6–10.3)
Chloride: 103 mmol/L (ref 98–110)
Creat: 1.1 mg/dL (ref 0.70–1.35)
Globulin: 2.8 g/dL (calc) (ref 1.9–3.7)
Glucose, Bld: 105 mg/dL — ABNORMAL HIGH (ref 65–99)
Potassium: 5.2 mmol/L (ref 3.5–5.3)
Sodium: 140 mmol/L (ref 135–146)
Total Bilirubin: 0.6 mg/dL (ref 0.2–1.2)
Total Protein: 7.3 g/dL (ref 6.1–8.1)
eGFR: 73 mL/min/{1.73_m2} (ref >=60)

## 2022-05-30 LAB — LIPID PANEL
Cholesterol: 175 mg/dL (ref <200)
HDL: 44 mg/dL (ref >=40)
LDL Cholesterol (Calc): 111 mg/dL (calc) — ABNORMAL HIGH
Non-HDL Cholesterol (Calc): 131 mg/dL (calc) — ABNORMAL HIGH (ref <130)
Total CHOL/HDL Ratio: 4 (calc) (ref <5.0)
Triglycerides: 97 mg/dL (ref <150)

## 2022-05-30 LAB — PSA: PSA: 1.6 ng/mL (ref <=4.00)

## 2022-05-30 LAB — CBC WITH DIFFERENTIAL/PLATELET
Absolute Monocytes: 634 cells/uL (ref 200–950)
Basophils Absolute: 62 cells/uL (ref 0–200)
Basophils Relative: 0.7 %
Eosinophils Absolute: 167 cells/uL (ref 15–500)
Eosinophils Relative: 1.9 %
HCT: 49.6 % (ref 38.5–50.0)
Hemoglobin: 16.6 g/dL (ref 13.2–17.1)
Lymphs Abs: 2279 cells/uL (ref 850–3900)
MCH: 32.1 pg (ref 27.0–33.0)
MCHC: 33.5 g/dL (ref 32.0–36.0)
MCV: 95.9 fL (ref 80.0–100.0)
MPV: 9.1 fL (ref 7.5–12.5)
Monocytes Relative: 7.2 %
Neutro Abs: 5658 cells/uL (ref 1500–7800)
Neutrophils Relative %: 64.3 %
Platelets: 322 10*3/uL (ref 140–400)
RBC: 5.17 10*6/uL (ref 4.20–5.80)
RDW: 12.2 % (ref 11.0–15.0)
Total Lymphocyte: 25.9 %
WBC: 8.8 10*3/uL (ref 3.8–10.8)

## 2022-07-02 IMAGING — DX DG CHEST 1V PORT
1 series · 1 of 1 positions shown · non-contrast
Comparison: 05/22/2020

CLINICAL DATA: COVID pneumonia.

EXAM:
PORTABLE CHEST 1 VIEW

[chest ap]
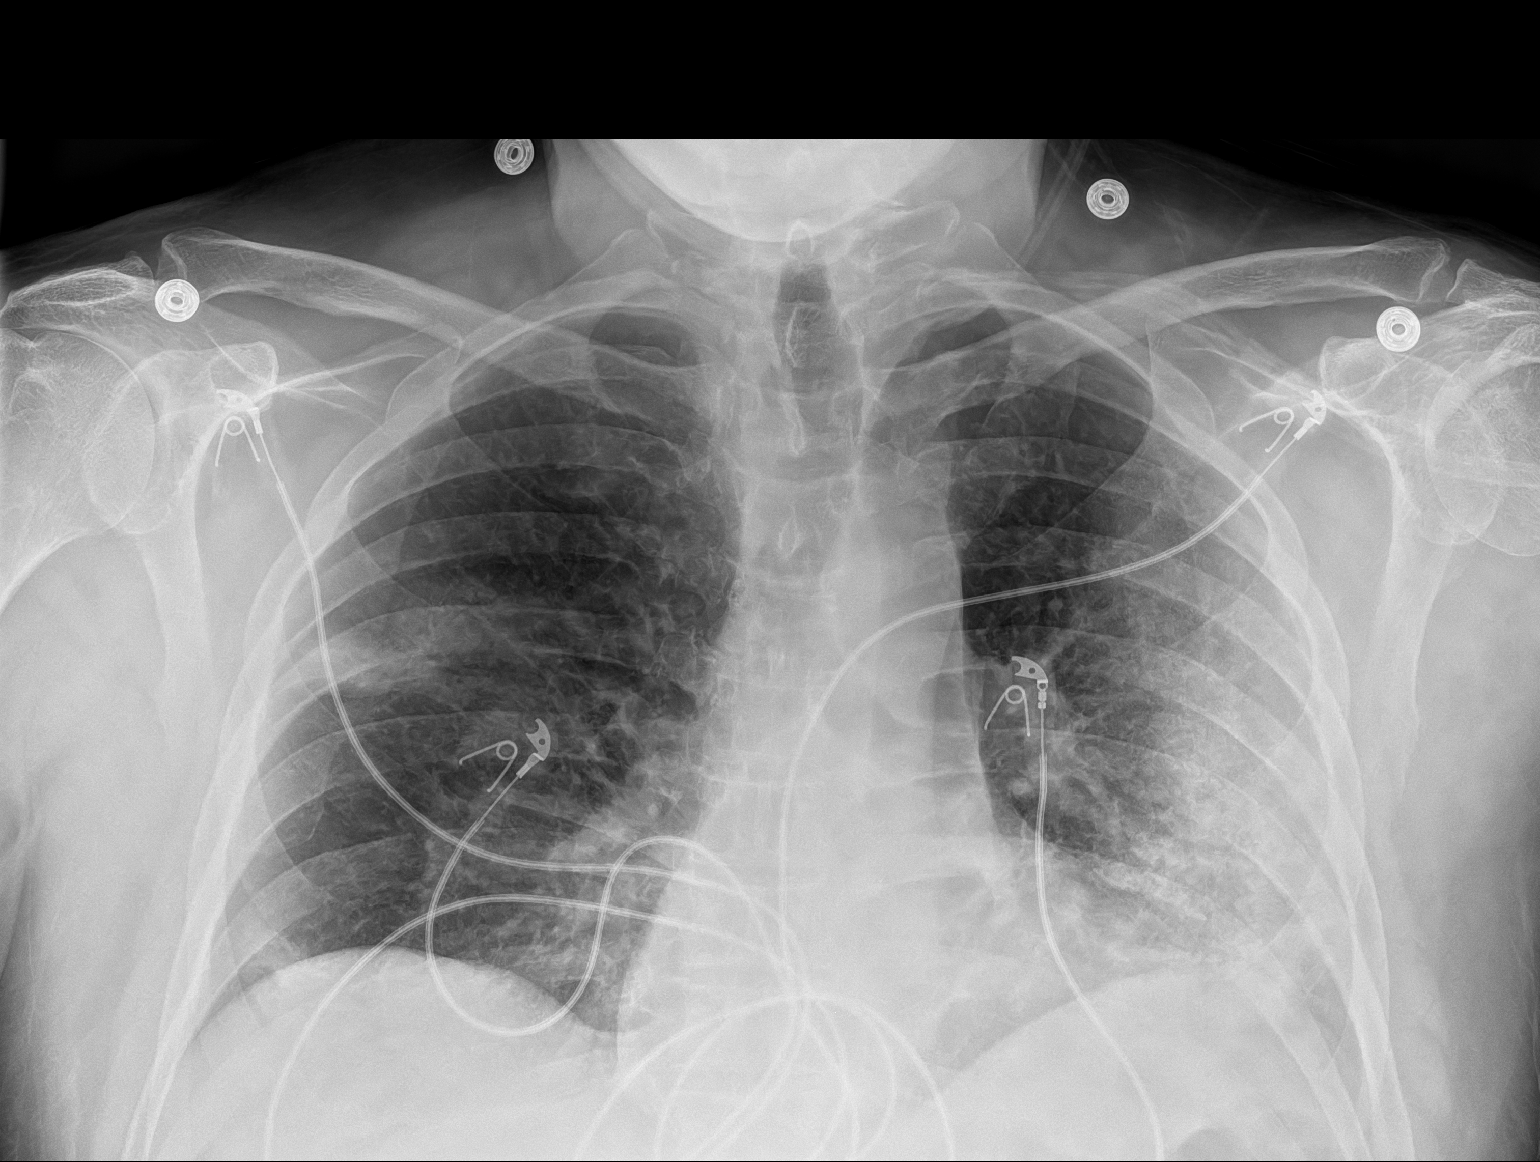

[1 of 1 positions shown; findings below may reference images not displayed]

FINDINGS: Interval progression of patchy airspace disease in the right mid
lung and right base. More confluent airspace opacity in the
periphery of the left lung is also progressive. Cardiopericardial
silhouette is at upper limits of normal for size. Telemetry leads
overlie the chest.
IMPRESSION: Progressive patchy airspace disease bilaterally compatible with
multifocal pneumonia.

## 2022-08-09 ENCOUNTER — Other Ambulatory Visit: Payer: Self-pay | Admitting: Family Medicine

## 2022-08-09 DIAGNOSIS — E7800 Pure hypercholesterolemia, unspecified: Secondary | ICD-10-CM

## 2022-08-09 NOTE — Telephone Encounter (Signed)
Requested Prescriptions  Pending Prescriptions Disp Refills   tamsulosin (FLOMAX) 0.4 MG CAPS capsule [Pharmacy Med Name: TAMSULOSIN 0.4MG  CAPSULES] 90 capsule 0    Sig: TAKE 1 CAPSULE BY MOUTH EVERY NIGHT AT BEDTIME     Urology: Alpha-Adrenergic Blocker Failed - 08/09/2022  6:32 AM      Failed - Valid encounter within last 12 months    Recent Outpatient Visits           1 year ago Benign essential HTN   [place] Pickard, Cammie Mcgee, MD   1 year ago General medical exam   [place] Susy Frizzle, MD   1 year ago Bacterial sinusitis   [place] Eulogio Bear, NP   2 years ago [place] Pickard, Cammie Mcgee, MD   2 years ago [place], Cammie Mcgee, MD              Passed - PSA in normal range and within 360 days    PSA  Date Value Ref Range Status  05/29/2022 1.60 < OR = 4.00 ng/mL Final    Comment:    The total PSA value from this assay system is  standardized against the WHO standard. The test  result will be approximately 20% lower when compared  to the equimolar-standardized total PSA (Beckman  Coulter). Comparison of serial PSA results should be  interpreted with this fact in mind. . This test was performed using the Siemens  chemiluminescent method. Values obtained from  different assay methods cannot be used interchangeably. PSA levels, regardless of value, should not be interpreted as absolute evidence of the presence or absence of disease.          Passed - Last BP in normal range    BP Readings from Last 1 Encounters:  05/29/22 132/80          rosuvastatin (CRESTOR) 20 MG tablet [Pharmacy Med Name: ROSUVASTATIN 20MG  TABLETS] 90 tablet 0    Sig: TAKE 1 TABLET(20 MG) BY MOUTH DAILY     Cardiovascular:  Antilipid - Statins 2 Failed - 08/09/2022  6:32 AM      Failed - Valid encounter within last 12 months    Recent Outpatient  Visits           1 year ago Benign essential HTN   [place] Pickard, Cammie Mcgee, MD   1 year ago General medical exam   [place] Susy Frizzle, MD   1 year ago Bacterial sinusitis   [place] Eulogio Bear, NP   2 years ago [place] Pickard, Cammie Mcgee, MD   2 years ago [place] Pickard, Cammie Mcgee, MD              Failed - Lipid Panel in normal range within the last 12 months    Cholesterol  Date Value Ref Range Status  05/29/2022 175 <200 mg/dL Final   LDL Cholesterol (Calc)  Date Value Ref Range Status  05/29/2022 111 (H) mg/dL (calc) Final    Comment:    Reference range: <100 . Desirable range <100 mg/dL for primary prevention;   <70 mg/dL for patients with CHD or diabetic patients  with > or = 2 CHD risk factors. Marland Kitchen LDL-C is now calculated using the Martin-Hopkins  calculation, which  is a validated novel method providing  better accuracy than the Friedewald equation in the  estimation of LDL-C.  Cresenciano Genre et al. Annamaria Helling. [id number]): 2061-2068  (http://education.[facility].com/faq/FAQ164)    HDL  Date Value Ref Range Status  05/29/2022 44 > OR = 40 mg/dL Final   Triglycerides  Date Value Ref Range Status  05/29/2022 97 <150 mg/dL Final         Passed - Cr in normal range and within 360 days    Creat  Date Value Ref Range Status  05/29/2022 1.10 0.70 - 1.35 mg/dL Final   Creatinine, Urine  Date Value Ref Range Status  05/22/2020 87.05 mg/dL Final         Passed - Patient is not pregnant

## 2022-08-31 ENCOUNTER — Telehealth: Payer: Self-pay | Admitting: Family Medicine

## 2022-08-31 NOTE — Telephone Encounter (Signed)
Contacted Daniel Rios to schedule their annual wellness visit. Appointment made for 09/06/2022.  Thank you,  Judeth Cornfield,  AMB Clinical Support [facility] AWV Program Direct Dial ??[phone]

## 2022-09-03 DIAGNOSIS — D2261 Melanocytic nevi of right upper limb, including shoulder: Secondary | ICD-10-CM | POA: Diagnosis not present

## 2022-09-03 DIAGNOSIS — D2271 Melanocytic nevi of right lower limb, including hip: Secondary | ICD-10-CM | POA: Diagnosis not present

## 2022-09-03 DIAGNOSIS — D2262 Melanocytic nevi of left upper limb, including shoulder: Secondary | ICD-10-CM | POA: Diagnosis not present

## 2022-09-03 DIAGNOSIS — D2272 Melanocytic nevi of left lower limb, including hip: Secondary | ICD-10-CM | POA: Diagnosis not present

## 2022-09-03 DIAGNOSIS — L570 Actinic keratosis: Secondary | ICD-10-CM | POA: Diagnosis not present

## 2022-09-03 DIAGNOSIS — Z85828 Personal history of other malignant neoplasm of skin: Secondary | ICD-10-CM | POA: Diagnosis not present

## 2022-09-06 ENCOUNTER — Ambulatory Visit (INDEPENDENT_AMBULATORY_CARE_PROVIDER_SITE_OTHER): Payer: PPO

## 2022-09-06 ENCOUNTER — Telehealth: Payer: Self-pay

## 2022-09-06 VITALS — BP 115/72 | Ht 72.0 in | Wt 186.0 lb

## 2022-09-06 DIAGNOSIS — Z0000 Encounter for general adult medical examination without abnormal findings: Secondary | ICD-10-CM

## 2022-09-06 NOTE — Patient Instructions (Signed)
Daniel Rios , Thank you for taking time to come for your Medicare Wellness Visit. I appreciate your ongoing commitment to your health goals. Please review the following plan we discussed and let me know if I can assist you in the future.   These are the goals we discussed:  Goals      Exercise 3x per week (30 min per time)     Stay active and eat healthy.        This is a list of the screening recommended for you and due dates:  Health Maintenance  Topic Date Due   COVID-19 Vaccine (1) Never done   Zoster (Shingles) Vaccine (1 of 2) Never done   Flu Shot  12/20/2022   Medicare Annual Wellness Visit  09/06/2023   Colon Cancer Screening  10/06/2026   DTaP/Tdap/Td vaccine (2 - Td or Tdap) 03/24/2028   Pneumonia Vaccine  Completed   Hepatitis C Screening: USPSTF Recommendation to screen - Ages 75-79 yo.  Completed   HPV Vaccine  Aged Out    Advanced directives: Please bring a copy of your health care power of attorney and living will to the office to be added to your chart at your convenience.   Conditions/risks identified: Aim for 30 minutes of exercise or brisk walking, 6-8 glasses of water, and 5 servings of fruits and vegetables each day.   Next appointment: Follow up in one year for your annual wellness visit.   Preventive Care 69 Years and Older, Male  Preventive care refers to lifestyle choices and visits with your health care provider that can promote health and wellness. What does preventive care include? A yearly physical exam. This is also called an annual well check. Dental exams once or twice a year. Routine eye exams. Ask your health care provider how often you should have your eyes checked. Personal lifestyle choices, including: Daily care of your teeth and gums. Regular physical activity. Eating a healthy diet. Avoiding tobacco and drug use. Limiting alcohol use. Practicing safe sex. Taking low doses of aspirin every day. Taking vitamin and mineral supplements  as recommended by your health care provider. What happens during an annual well check? The services and screenings done by your health care provider during your annual well check will depend on your age, overall health, lifestyle risk factors, and family history of disease. Counseling  Your health care provider may ask you questions about your: Alcohol use. Tobacco use. Drug use. Emotional well-being. Home and relationship well-being. Sexual activity. Eating habits. History of falls. Memory and ability to understand (cognition). Work and work Astronomer. Screening  You may have the following tests or measurements: Height, weight, and BMI. Blood pressure. Lipid and cholesterol levels. These may be checked every 5 years, or more frequently if you are over 27 years old. Skin check. Lung cancer screening. You may have this screening every year starting at age 69 if you have a 30-pack-year history of smoking and currently smoke or have quit within the past 15 years. Fecal occult blood test (FOBT) of the stool. You may have this test every year starting at age 69. Flexible sigmoidoscopy or colonoscopy. You may have a sigmoidoscopy every 5 years or a colonoscopy every 10 years starting at age 69. Prostate cancer screening. Recommendations will vary depending on your family history and other risks. Hepatitis C blood test. Hepatitis B blood test. Sexually transmitted disease (STD) testing. Diabetes screening. This is done by checking your blood sugar (glucose) after you have not  eaten for a while (fasting). You may have this done every 1-3 years. Abdominal aortic aneurysm (AAA) screening. You may need this if you are a current or former smoker. Osteoporosis. You may be screened starting at age 69 if you are at high risk. Talk with your health care provider about your test results, treatment options, and if necessary, the need for more tests. Vaccines  Your health care provider may recommend  certain vaccines, such as: Influenza vaccine. This is recommended every year. Tetanus, diphtheria, and acellular pertussis (Tdap, Td) vaccine. You may need a Td booster every 10 years. Zoster vaccine. You may need this after age 40. Pneumococcal 13-valent conjugate (PCV13) vaccine. One dose is recommended after age 69. Pneumococcal polysaccharide (PPSV23) vaccine. One dose is recommended after age 69. Talk to your health care provider about which screenings and vaccines you need and how often you need them. This information is not intended to replace advice given to you by your health care provider. Make sure you discuss any questions you have with your health care provider. Document Released: 06/03/2015 Document Revised: 01/25/2016 Document Reviewed: 03/08/2015 Elsevier Interactive Patient Education  2017 [place] Prevention in the Home Falls can cause injuries. They can happen to people of all ages. There are many things you can do to make your home safe and to help prevent falls. What can I do on the outside of my home? Regularly fix the edges of walkways and driveways and fix any cracks. Remove anything that might make you trip as you walk through a door, such as a raised step or threshold. Trim any bushes or trees on the path to your home. Use bright outdoor lighting. Clear any walking paths of anything that might make someone trip, such as rocks or tools. Regularly check to see if handrails are loose or broken. Make sure that both sides of any steps have handrails. Any raised decks and porches should have guardrails on the edges. Have any leaves, snow, or ice cleared regularly. Use sand or salt on walking paths during winter. Clean up any spills in your garage right away. This includes oil or grease spills. What can I do in the bathroom? Use night lights. Install grab bars by the toilet and in the tub and shower. Do not use towel bars as grab bars. Use non-skid mats or  decals in the tub or shower. If you need to sit down in the shower, use a plastic, non-slip stool. Keep the floor dry. Clean up any water that spills on the floor as soon as it happens. Remove soap buildup in the tub or shower regularly. Attach bath mats securely with double-sided non-slip rug tape. Do not have throw rugs and other things on the floor that can make you trip. What can I do in the bedroom? Use night lights. Make sure that you have a light by your bed that is easy to reach. Do not use any sheets or blankets that are too big for your bed. They should not hang down onto the floor. Have a firm chair that has side arms. You can use this for support while you get dressed. Do not have throw rugs and other things on the floor that can make you trip. What can I do in the kitchen? Clean up any spills right away. Avoid walking on wet floors. Keep items that you use a lot in easy-to-reach places. If you need to reach something above you, use a strong step stool that  has a grab bar. Keep electrical cords out of the way. Do not use floor polish or wax that makes floors slippery. If you must use wax, use non-skid floor wax. Do not have throw rugs and other things on the floor that can make you trip. What can I do with my stairs? Do not leave any items on the stairs. Make sure that there are handrails on both sides of the stairs and use them. Fix handrails that are broken or loose. Make sure that handrails are as long as the stairways. Check any carpeting to make sure that it is firmly attached to the stairs. Fix any carpet that is loose or worn. Avoid having throw rugs at the top or bottom of the stairs. If you do have throw rugs, attach them to the floor with carpet tape. Make sure that you have a light switch at the top of the stairs and the bottom of the stairs. If you do not have them, ask someone to add them for you. What else can I do to help prevent falls? Wear shoes that: Do not  have high heels. Have rubber bottoms. Are comfortable and fit you well. Are closed at the toe. Do not wear sandals. If you use a stepladder: Make sure that it is fully opened. Do not climb a closed stepladder. Make sure that both sides of the stepladder are locked into place. Ask someone to hold it for you, if possible. Clearly mark and make sure that you can see: Any grab bars or handrails. First and last steps. Where the edge of each step is. Use tools that help you move around (mobility aids) if they are needed. These include: Canes. Walkers. Scooters. Crutches. Turn on the lights when you go into a dark area. Replace any light bulbs as soon as they burn out. Set up your furniture so you have a clear path. Avoid moving your furniture around. If any of your floors are uneven, fix them. If there are any pets around you, be aware of where they are. Review your medicines with your doctor. Some medicines can make you feel dizzy. This can increase your chance of falling. Ask your doctor what other things that you can do to help prevent falls. This information is not intended to replace advice given to you by your health care provider. Make sure you discuss any questions you have with your health care provider. Document Released: 03/03/2009 Document Revised: 10/13/2015 Document Reviewed: 06/11/2014 Elsevier Interactive Patient Education  2017 [employer].

## 2022-09-06 NOTE — Telephone Encounter (Signed)
Patient seen for AWV and states that he has noted with the warmer weather that his blood pressure is staying low.  He has dizziness with change in positions.  He is drinking plenty of fluids.  Blood pressure was 98/68 yesterday mid day and this morning was 115/72 30 minutes after taking meds.  Please advise. He is asking if he can stop one of his blood pressure medications.

## 2022-09-06 NOTE — Progress Notes (Signed)
Subjective:   Daniel Rios is a 67 y.o. male who presents for Medicare Annual/Subsequent preventive examination.  I connected with  Zigmund Gottron on 09/06/22 by a audio enabled telemedicine application and verified that I am speaking with the correct person using two identifiers.  Patient Location: Home  Provider Location: Office/Clinic  I discussed the limitations of evaluation and management by telemedicine. The patient expressed understanding and agreed to proceed.  Review of Systems     Cardiac Risk Factors include: advanced age (>44men, >30 women);dyslipidemia;hypertension;male gender     Objective:    Today's Vitals   09/06/22 0942  BP: 115/72  Weight: 186 lb (84.4 kg)  Height: 6' (1.829 m)   Body mass index is 25.23 kg/m.     09/06/2022    9:46 AM 10/05/2021    3:44 PM 09/29/2020    8:20 AM 05/23/2020    4:34 PM 05/22/2020   12:19 PM 02/07/2017   10:29 AM 05/07/2015    1:47 PM  Advanced Directives  Does Patient Have a Medical Advance Directive? Yes No No  No No No  Type of Advance Directive Living will;Healthcare Power of Attorney        Does patient want to make changes to medical advance directive? No - Patient declined        Copy of Healthcare Power of Attorney in Chart? No - copy requested        Would patient like information on creating a medical advance directive?  No - Patient declined No - Patient declined No - Patient declined  No - Patient declined No - patient declined information    Current Medications (verified) Outpatient Encounter Medications as of 09/06/2022  Medication Sig   amLODipine (NORVASC) 5 MG tablet TAKE 1 TABLET(5 MG) BY MOUTH DAILY   losartan (COZAAR) 100 MG tablet TAKE 1 TABLET(100 MG) BY MOUTH DAILY   Multiple Vitamin (MULTIVITAMIN) tablet Take 1 tablet by mouth daily. One a day 50+   Omega-3 Fatty Acids (FISH OIL) 1360 MG CAPS Take by mouth.   rosuvastatin (CRESTOR) 20 MG tablet TAKE 1 TABLET(20 MG) BY MOUTH DAILY   tamsulosin  (FLOMAX) 0.4 MG CAPS capsule TAKE 1 CAPSULE BY MOUTH EVERY NIGHT AT BEDTIME   No facility-administered encounter medications on file as of 09/06/2022.    Allergies (verified) Celebrex [celecoxib]   History: Past Medical History:  Diagnosis Date   Allergy    Hyperlipidemia    Hypertension    Strabismus    Past Surgical History:  Procedure Laterality Date   COLONOSCOPY  11-01-2003   normal   COLONOSCOPY  06/21/2014   1-TA   FOOT SURGERY Right 2019   Family History  Problem Relation Age of Onset   Arthritis Mother    Cancer Father        had metas.all over when found-? starting point    COPD Brother    Colon cancer Maternal Aunt    Colon polyps Neg Hx    Esophageal cancer Neg Hx    Rectal cancer Neg Hx    Stomach cancer Neg Hx    Social History   Socioeconomic History   Marital status: Married    Spouse name: Not on file   Number of children: Not on file   Years of education: Not on file   Highest education level: Not on file  Occupational History   Not on file  Tobacco Use   Smoking status: Former    Packs/day: 0.00  Years: 2.00    Additional pack years: 0.00    Total pack years: 0.00    Types: Cigarettes    Quit date: 09/22/1982    Years since quitting: 39.9   Smokeless tobacco: Never  Vaping Use   Vaping Use: Never used  Substance and Sexual Activity   Alcohol use: Yes   Drug use: No   Sexual activity: Not on file    Comment: married, quit smoking in 1982.    Other Topics Concern   Not on file  Social History Narrative   Not on file   Social Determinants of Health   Financial Resource Strain: Low Risk  (09/06/2022)   Overall Financial Resource Strain (CARDIA)    Difficulty of Paying Living Expenses: Not hard at all  Food Insecurity: No Food Insecurity (09/06/2022)   Hunger Vital Sign    Worried About Running Out of Food in the Last Year: Never true    Ran Out of Food in the Last Year: Never true  Transportation Needs: No Transportation Needs  (09/06/2022)   PRAPARE - Administrator, Civil Service (Medical): No    Lack of Transportation (Non-Medical): No  Physical Activity: Sufficiently Active (09/06/2022)   Exercise Vital Sign    Days of Exercise per Week: 5 days    Minutes of Exercise per Session: 60 min  Stress: No Stress Concern Present (09/06/2022)   [employer] of Occupational Health - Occupational Stress Questionnaire    Feeling of Stress : Not at all  Social Connections: Socially Integrated (09/06/2022)   Social Connection and Isolation Panel [NHANES]    Frequency of Communication with Friends and Family: More than three times a week    Frequency of Social Gatherings with Friends and Family: More than three times a week    Attends Religious Services: 1 to 4 times per year    Active Member of [employer] or Organizations: Yes    Attends Engineer, structural: More than 4 times per year    Marital Status: Married    Tobacco Counseling Counseling given: Not Answered   Clinical Intake:  Pre-visit preparation completed: Yes  Pain : No/denies pain  Diabetes: No  How often do you need to have someone help you when you read instructions, pamphlets, or other written materials from your doctor or pharmacy?: 1 - Never  Diabetic?No   Interpreter Needed?: No  Information entered by :: Kandis Fantasia LPN   Activities of Daily Living    09/06/2022    9:46 AM 10/05/2021    3:45 PM  In your present state of health, do you have any difficulty performing the following activities:  Hearing? 0 0  Vision? 0 0  Difficulty concentrating or making decisions? 0 0  Walking or climbing stairs? 0 0  Dressing or bathing? 0 0  Doing errands, shopping? 0 0  Preparing Food and eating ? N N  Using the Toilet? N N  In the past six months, have you accidently leaked urine? N Y  Comment  Currently on Flowmax  Do you have problems with loss of bowel control? N N  Managing your Medications? N N  Managing your  Finances? N N  Housekeeping or managing your Housekeeping? N N    Patient Care Team: Donita Brooks, MD as PCP - General (Family Medicine) Carman Ching, OD (Optometry) Wynona Canes, MD as Referring Physician (Specialist)  Indicate any recent Medical Services you may have received from other than [facility]  providers in the past year (date may be approximate).     Assessment:   This is a routine wellness examination for Johnrobert.  Hearing/Vision screen Hearing Screening - Comments:: Denies hearing difficulties   Vision Screening - Comments:: up to date with routine eye exams with [facility]    Dietary issues and exercise activities discussed: Current Exercise Habits: Home exercise routine, Type of exercise: walking;stretching;strength training/weights, Time (Minutes): 60, Frequency (Times/Week): 5, Weekly Exercise (Minutes/Week): 300, Intensity: Moderate   Goals Addressed   None   Depression Screen    09/06/2022    9:45 AM 05/29/2022   11:04 AM 01/08/2022    3:40 PM 10/05/2021    3:35 PM 09/29/2020    8:18 AM 08/17/2019    8:21 AM 03/24/2018    9:29 AM  PHQ 2/9 Scores  PHQ - 2 Score 0 0 0 0 0 0 0  PHQ- 9 Score     0      Fall Risk    09/06/2022    9:44 AM 05/29/2022   11:04 AM 10/05/2021    3:44 PM 09/29/2020    8:18 AM 08/17/2019    8:21 AM  Fall Risk   Falls in the past year? 0 0 0 0 0  Number falls in past yr: 0 0 0    Injury with Fall? 0 0 0    Risk for fall due to : No Fall Risks No Fall Risks No Fall Risks No Fall Risks   Follow up Falls prevention discussed;Education provided;Falls evaluation completed Falls prevention discussed Falls prevention discussed Falls evaluation completed Falls evaluation completed    FALL RISK PREVENTION PERTAINING TO THE HOME:  Any stairs in or around the home? Yes  If so, are there any without handrails? No  Home free of loose throw rugs in walkways, pet beds, electrical cords, etc? Yes  Adequate lighting in your home to reduce  risk of falls? Yes   ASSISTIVE DEVICES UTILIZED TO PREVENT FALLS:  Life alert? No  Use of a cane, walker or w/c? No  Grab bars in the bathroom? Yes  Shower chair or bench in shower? No  Elevated toilet seat or a handicapped toilet? Yes   TIMED UP AND GO:  Was the test performed? No . Telephonic visit   Cognitive Function:        09/06/2022    9:46 AM 10/05/2021    3:46 PM  6CIT Screen  What Year? 0 points 0 points  What month? 0 points 0 points  What time? 0 points 0 points  Count back from 20 0 points 0 points  Months in reverse 0 points 0 points  Repeat phrase 0 points 0 points  Total Score 0 points 0 points    Immunizations Immunization History  Administered Date(s) Administered   Influenza Split 03/22/2014   Influenza Whole 05/22/2019   Influenza,inj,Quad PF,6+ Mos 02/07/2017, 03/24/2018   Pneumococcal Conjugate-13 12/11/2019   Pneumococcal Polysaccharide-23 09/29/2020   Tdap 03/24/2018    TDAP status: Up to date  Flu Vaccine status: Up to date  Pneumococcal vaccine status: Up to date  Covid-19 vaccine status: Information provided on how to obtain vaccines.   Qualifies for Shingles Vaccine? Yes   Zostavax completed No   Shingrix Completed?: No.    Education has been provided regarding the importance of this vaccine. Patient has been advised to call insurance company to determine out of pocket expense if they have not yet received this vaccine. Advised may also  receive vaccine at local pharmacy or Health Dept. Verbalized acceptance and understanding.  Screening Tests Health Maintenance  Topic Date Due   COVID-19 Vaccine (1) Never done   Zoster Vaccines- Shingrix (1 of 2) Never done   INFLUENZA VACCINE  12/20/2022   Medicare Annual Wellness (AWV)  09/06/2023   COLONOSCOPY (Pts 45-63yrs Insurance coverage will need to be confirmed)  10/06/2026   DTaP/Tdap/Td (2 - Td or Tdap) 03/24/2028   Pneumonia Vaccine 31+ Years old  Completed   Hepatitis C Screening   Completed   HPV VACCINES  Aged Out    Health Maintenance  Health Maintenance Due  Topic Date Due   COVID-19 Vaccine (1) Never done   Zoster Vaccines- Shingrix (1 of 2) Never done    Colorectal cancer screening: Type of screening: Colonoscopy. Completed 10/06/19. Repeat every 7 years  Lung Cancer Screening: (Low Dose CT Chest recommended if Age 48-80 years, 30 pack-year currently smoking OR have quit w/in 15years.) does not qualify.   Lung Cancer Screening Referral: n/a  Additional Screening:  Hepatitis C Screening: does qualify; Completed 03/24/18  Vision Screening: Recommended annual ophthalmology exams for early detection of glaucoma and other disorders of the eye. Is the patient up to date with their annual eye exam?  Yes  Who is the provider or what is the name of the office in which the patient attends annual eye exams? Dr. [facility]  If pt is not established with a provider, would they like to be referred to a provider to establish care? No .   Dental Screening: Recommended annual dental exams for proper oral hygiene  Community Resource Referral / Chronic Care Management: CRR required this visit?  No   CCM required this visit?  No      Plan:     I have personally reviewed and noted the following in the patient's chart:   Medical and social history Use of alcohol, tobacco or illicit drugs  Current medications and supplements including opioid prescriptions. Patient is not currently taking opioid prescriptions. Functional ability and status Nutritional status Physical activity Advanced directives List of other physicians Hospitalizations, surgeries, and ER visits in previous 12 months Vitals Screenings to include cognitive, depression, and falls Referrals and appointments  In addition, I have reviewed and discussed with patient certain preventive protocols, quality metrics, and best practice recommendations. A written personalized care plan for preventive services  as well as general preventive health recommendations were provided to patient.     Durwin Nora, [place]   5/62/1308   Due to this being a virtual visit, the after visit summary with patients personalized plan was offered to patient via mail or my-chart.  Patient would like to access on my-chart  Nurse Notes: See telephone note with patient concerns

## 2022-11-05 ENCOUNTER — Other Ambulatory Visit: Payer: Self-pay | Admitting: Family Medicine

## 2022-11-05 DIAGNOSIS — E7800 Pure hypercholesterolemia, unspecified: Secondary | ICD-10-CM

## 2022-12-03 ENCOUNTER — Ambulatory Visit: Payer: Self-pay | Admitting: *Deleted

## 2022-12-03 NOTE — Telephone Encounter (Signed)
  Chief Complaint: dizziness , lightheaded Symptoms: worse sx when outside in heat. Lightheaded, feels like passing out. More often and usually gets in cool air lightheaded goes away but not now.  Frequency: more often Pertinent Negatives: Patient denies chest pain no difficulty breathing has not passed out  Disposition: [] ED /[x] Urgent Care (no appt availability in office) / [] Appointment(In office/virtual)/ []  [facility]/ [] Home Care/ [] Refused Recommended Disposition /[] [place] [facility]/ []  Follow-up with PCP Additional Notes:   Recommended UC due to progress pattern of dizziness and not going away. No available appt until Thursday  with PCP. Please advise.     Reason for Disposition  [1] MODERATE dizziness (e.g., interferes with normal activities) AND [2] has NOT been evaluated by doctor (or NP/PA) for this  (Exception: Dizziness caused by heat exposure, sudden standing, or poor fluid intake.)  Answer Assessment - Initial Assessment Questions 1. DESCRIPTION: "Describe your dizziness."     Lightheaded with standing or movement, esp in heat.  2. LIGHTHEADED: "Do you feel lightheaded?" (e.g., somewhat faint, woozy, weak upon standing)     Somewhat faint feeling , usually decreased in cool air but not going away  3. VERTIGO: "Do you feel like either you or the room is spinning or tilting?" (i.e. vertigo)     no 4. SEVERITY: "How bad is it?"  "Do you feel like you are going to faint?" "Can you stand and walk?"   - MILD: Feels slightly dizzy, but walking normally.   - MODERATE: Feels unsteady when walking, but not falling; interferes with normal activities (e.g., school, work).   - SEVERE: Unable to walk without falling, or requires assistance to walk without falling; feels like passing out now.      Noted with walking standing  5. ONSET:  "When did the dizziness begin?"     Progressively get worse 6. AGGRAVATING FACTORS: "Does anything make it worse?" (e.g., standing,  change in head position)     Standing or trying to walk, squatting causes worsening lightheadedness 7. HEART RATE: "Can you tell me your heart rate?" "How many beats in 15 seconds?"  (Note: not all patients can do this)       na 8. CAUSE: "What do you think is causing the dizziness?"     Not sure  9. RECURRENT SYMPTOM: "Have you had dizziness before?" If Yes, ask: "When was the last time?" "What happened that time?"     Yes not this often  10. OTHER SYMPTOMS: "Do you have any other symptoms?" (e.g., fever, chest pain, vomiting, diarrhea, bleeding)       Lightheadedness standing . Worse with heat.  11. PREGNANCY: "Is there any chance you are pregnant?" "When was your last menstrual period?"       na  Protocols used: Dizziness - Lightheadedness-A-AH

## 2022-12-05 ENCOUNTER — Other Ambulatory Visit: Payer: Self-pay | Admitting: Family Medicine

## 2022-12-05 NOTE — Telephone Encounter (Signed)
OV-05/29/22 Requested Prescriptions  Pending Prescriptions Disp Refills   losartan (COZAAR) 100 MG tablet [Pharmacy Med Name: LOSARTAN 100MG  TABLETS] 90 tablet 0    Sig: TAKE 1 TABLET BY MOUTH DAILY     Cardiovascular:  Angiotensin Receptor Blockers Failed - 12/05/2022  4:06 AM      Failed - Cr in normal range and within 180 days    Creat  Date Value Ref Range Status  05/29/2022 1.10 0.70 - 1.35 mg/dL Final   Creatinine, Urine  Date Value Ref Range Status  05/22/2020 87.05 mg/dL Final         Failed - K in normal range and within 180 days    Potassium  Date Value Ref Range Status  05/29/2022 5.2 3.5 - 5.3 mmol/L Final         Failed - Valid encounter within last 6 months    Recent Outpatient Visits           1 year ago Benign essential HTN   [facility] Donita Brooks, MD   2 years ago General medical exam   [facility] Donita Brooks, MD   2 years ago Bacterial sinusitis   [facility] Valentino Nose, NP   2 years ago COVID-19   [facility] Pickard, Priscille Heidelberg, MD   2 years ago COVID-19   [facility] Pickard, Priscille Heidelberg, MD              Passed - Patient is not pregnant      Passed - Last BP in normal range    BP Readings from Last 1 Encounters:  09/06/22 115/72

## 2023-01-01 ENCOUNTER — Ambulatory Visit: Payer: PPO | Admitting: Family Medicine

## 2023-01-06 ENCOUNTER — Encounter: Payer: Self-pay | Admitting: Pharmacist

## 2023-01-06 NOTE — Progress Notes (Signed)
Pharmacy Quality Measure Review  This patient is appearing on a report for being at risk of failing the adherence measure for hypertension (ACEi/ARB) medications this calendar year.   Medication: losartan 100 mg daily Last fill date: 12/04/22 for 90 day supply  Insurance report was not up to date. No action needed at this time.   Adam Phenix, PharmD PGY-1 Pharmacy Resident

## 2023-01-08 ENCOUNTER — Ambulatory Visit (INDEPENDENT_AMBULATORY_CARE_PROVIDER_SITE_OTHER): Payer: PPO | Admitting: Family Medicine

## 2023-01-08 ENCOUNTER — Encounter: Payer: Self-pay | Admitting: Family Medicine

## 2023-01-08 VITALS — BP 120/62 | HR 58 | Temp 98.1°F | Ht 72.0 in | Wt 185.0 lb

## 2023-01-08 DIAGNOSIS — K529 Noninfective gastroenteritis and colitis, unspecified: Secondary | ICD-10-CM | POA: Diagnosis not present

## 2023-01-08 NOTE — Progress Notes (Signed)
Subjective:    Patient ID: Daniel Rios, male    DOB: 09-24-1955, 67 y.o.   MRN: [id number] Patient has been battling diarrhea and loose stools for the last 5 to 6 weeks.  He denies any abdominal pain.  However he reports rumbling in his stomach.  He states that his stools are poorly formed.  He has increased gas and bloating.  He has watery stool.  He is not losing weight.  He denies any melena or hematochezia or nausea or vomiting or fevers or chills.  He denies any recent travel.  He denies any sick contacts. Wt Readings from Last 3 Encounters:  01/08/23 185 lb (83.9 kg)  09/06/22 186 lb (84.4 kg)  05/29/22 186 lb 6.4 oz (84.6 kg)    Past Medical History:  Diagnosis Date   Allergy    Hyperlipidemia    Hypertension    Strabismus    Past Surgical History:  Procedure Laterality Date   COLONOSCOPY  11-01-2003   normal   COLONOSCOPY  06/21/2014   1-TA   FOOT SURGERY Right 2019   Current Outpatient Medications on File Prior to Visit  Medication Sig Dispense Refill   amLODipine (NORVASC) 5 MG tablet TAKE 1 TABLET(5 MG) BY MOUTH DAILY 90 tablet 1   losartan (COZAAR) 100 MG tablet TAKE 1 TABLET BY MOUTH DAILY 90 tablet 0   Multiple Vitamin (MULTIVITAMIN) tablet Take 1 tablet by mouth daily. One a day 50+     rosuvastatin (CRESTOR) 20 MG tablet TAKE 1 TABLET(20 MG) BY MOUTH DAILY 90 tablet 0   tamsulosin (FLOMAX) 0.4 MG CAPS capsule TAKE 1 CAPSULE BY MOUTH EVERY NIGHT AT BEDTIME 90 capsule 0   Omega-3 Fatty Acids (FISH OIL) 1360 MG CAPS Take by mouth.     No current facility-administered medications on file prior to visit.     Allergies  Allergen Reactions   Celebrex [Celecoxib] Swelling and Rash   Social History   Socioeconomic History   Marital status: Married    Spouse name: Not on file   Number of children: Not on file   Years of education: Not on file   Highest education level: Not on file  Occupational History   Not on file  Tobacco Use   Smoking status: Former     Current packs/day: 0.00    Types: Cigarettes    Quit date: 09/21/1980    Years since quitting: 42.3   Smokeless tobacco: Never  Vaping Use   Vaping status: Never Used  Substance and Sexual Activity   Alcohol use: Yes   Drug use: No   Sexual activity: Not on file    Comment: married, quit smoking in 1982.    Other Topics Concern   Not on file  Social History Narrative   Not on file   Social Determinants of Health   Financial Resource Strain: Low Risk  (09/06/2022)   Overall Financial Resource Strain (CARDIA)    Difficulty of Paying Living Expenses: Not hard at all  Food Insecurity: No Food Insecurity (09/06/2022)   Hunger Vital Sign    Worried About Running Out of Food in the Last Year: Never true    Ran Out of Food in the Last Year: Never true  Transportation Needs: No Transportation Needs (09/06/2022)   PRAPARE - Administrator, Civil Service (Medical): No    Lack of Transportation (Non-Medical): No  Physical Activity: Sufficiently Active (09/06/2022)   Exercise Vital Sign    Days of  Exercise per Week: 5 days    Minutes of Exercise per Session: 60 min  Stress: No Stress Concern Present (09/06/2022)   [employer] of Occupational Health - Occupational Stress Questionnaire    Feeling of Stress : Not at all  Social Connections: Socially Integrated (09/06/2022)   Social Connection and Isolation Panel [NHANES]    Frequency of Communication with Friends and Family: More than three times a week    Frequency of Social Gatherings with Friends and Family: More than three times a week    Attends Religious Services: 1 to 4 times per year    Active Member of [employer] or Organizations: Yes    Attends Engineer, structural: More than 4 times per year    Marital Status: Married  Catering manager Violence: Not At Risk (09/06/2022)   Humiliation, Afraid, Rape, and Kick questionnaire    Fear of Current or Ex-Partner: No    Emotionally Abused: No    Physically Abused: No     Sexually Abused: No     Review of Systems  Gastrointestinal:  Positive for diarrhea.  All other systems reviewed and are negative.      Objective:   Physical Exam Vitals reviewed.  Constitutional:      General: He is not in acute distress.    Appearance: He is well-developed. He is not diaphoretic.  HENT:     Head: Normocephalic and atraumatic.     Right Ear: External ear normal.     Left Ear: External ear normal.     Nose: Nose normal.     Mouth/Throat:     Pharynx: No oropharyngeal exudate.  Eyes:     General: No scleral icterus.       Right eye: No discharge.        Left eye: No discharge.     Conjunctiva/sclera: Conjunctivae normal.     Pupils: Pupils are equal, round, and reactive to light.  Neck:     Thyroid: No thyromegaly.     Vascular: No JVD.     Trachea: No tracheal deviation.  Cardiovascular:     Rate and Rhythm: Normal rate and regular rhythm.     Heart sounds: Normal heart sounds. No murmur heard.    No friction rub. No gallop.  Pulmonary:     Effort: Pulmonary effort is normal. No respiratory distress.     Breath sounds: Normal breath sounds. No stridor. No wheezing or rales.  Chest:     Chest wall: No tenderness.  Abdominal:     General: Bowel sounds are normal. There is no distension.     Palpations: Abdomen is soft. There is no mass.     Tenderness: There is no abdominal tenderness. There is no guarding or rebound.  Musculoskeletal:        General: No tenderness. Normal range of motion.     Cervical back: Normal range of motion and neck supple.  Lymphadenopathy:     Cervical: No cervical adenopathy.  Skin:    General: Skin is warm.     Coloration: Skin is not pale.     Findings: No erythema or rash.  Neurological:     Mental Status: He is alert and oriented to person, place, and time.     Cranial Nerves: No cranial nerve deficit.     Motor: No abnormal muscle tone.     Coordination: Coordination normal.     Deep Tendon Reflexes:  Reflexes are normal and symmetric.  Psychiatric:  Behavior: Behavior normal.        Thought Content: Thought content normal.        Judgment: Judgment normal.           Assessment & Plan:  Chronic diarrhea - Plan: Salmonella/Shigella Cult, Campy EIA and Shiga Toxin reflex, C difficile Toxins A+B W/Rflx, Ova and parasite examination We will check stool cultures, stool O&P, and stool for C. difficile.  Meanwhile start 5, align, 1 pill daily as well as a fiber supplement such as Metamucil.  Reassess in 2 to 3 weeks if stool cultures are negative to assess for improvement.  If not, may recommend GI for colonoscopy

## 2023-01-28 ENCOUNTER — Other Ambulatory Visit: Payer: PPO

## 2023-01-28 DIAGNOSIS — Z0000 Encounter for general adult medical examination without abnormal findings: Secondary | ICD-10-CM | POA: Diagnosis not present

## 2023-01-29 LAB — CBC WITH DIFFERENTIAL/PLATELET
Absolute Monocytes: 590 {cells}/uL (ref 200–950)
Basophils Absolute: 60 {cells}/uL (ref 0–200)
Basophils Relative: 0.9 %
Eosinophils Absolute: 147 {cells}/uL (ref 15–500)
Eosinophils Relative: 2.2 %
HCT: 47.1 % (ref 38.5–50.0)
Hemoglobin: 15.8 g/dL (ref 13.2–17.1)
Lymphs Abs: 1843 {cells}/uL (ref 850–3900)
MCH: 33 pg (ref 27.0–33.0)
MCHC: 33.5 g/dL (ref 32.0–36.0)
MCV: 98.3 fL (ref 80.0–100.0)
MPV: 9.6 fL (ref 7.5–12.5)
Monocytes Relative: 8.8 %
Neutro Abs: 4060 {cells}/uL (ref 1500–7800)
Neutrophils Relative %: 60.6 %
Platelets: 300 10*3/uL (ref 140–400)
RBC: 4.79 10*6/uL (ref 4.20–5.80)
RDW: 12.2 % (ref 11.0–15.0)
Total Lymphocyte: 27.5 %
WBC: 6.7 10*3/uL (ref 3.8–10.8)

## 2023-01-29 LAB — COMPLETE METABOLIC PANEL WITH GFR
AG Ratio: 1.7 (calc) (ref 1.0–2.5)
ALT: 22 U/L (ref 9–46)
AST: 26 U/L (ref 10–35)
Albumin: 4.2 g/dL (ref 3.6–5.1)
Alkaline phosphatase (APISO): 64 U/L (ref 35–144)
BUN: 17 mg/dL (ref 7–25)
CO2: 28 mmol/L (ref 20–32)
Calcium: 9.5 mg/dL (ref 8.6–10.3)
Chloride: 105 mmol/L (ref 98–110)
Creat: 1.18 mg/dL (ref 0.70–1.35)
Globulin: 2.5 g/dL (ref 1.9–3.7)
Glucose, Bld: 113 mg/dL — ABNORMAL HIGH (ref 65–99)
Potassium: 4.8 mmol/L (ref 3.5–5.3)
Sodium: 139 mmol/L (ref 135–146)
Total Bilirubin: 0.5 mg/dL (ref 0.2–1.2)
Total Protein: 6.7 g/dL (ref 6.1–8.1)
eGFR: 67 mL/min/{1.73_m2} (ref >=60)

## 2023-01-29 LAB — LIPID PANEL
Cholesterol: 150 mg/dL (ref <200)
HDL: 51 mg/dL (ref >=40)
LDL Cholesterol (Calc): 85 mg/dL
Non-HDL Cholesterol (Calc): 99 mg/dL (ref <130)
Total CHOL/HDL Ratio: 2.9 (calc) (ref <5.0)
Triglycerides: 65 mg/dL (ref <150)

## 2023-01-30 ENCOUNTER — Encounter: Payer: Self-pay | Admitting: Family Medicine

## 2023-01-30 NOTE — Telephone Encounter (Signed)
Patient called to follow up on request for Paxlovid; aches, pains, fever, runny nose, queasy stomach.   Sx began 01/29/23.  Pharmacy confirmed as:  [facility] [phone] - [place], [place] - [address] AT [facility] [address], [place] [address] Phone: [phone]  Fax: [phone] DEA #: [id number]   Please advise at [phone]

## 2023-01-31 ENCOUNTER — Other Ambulatory Visit: Payer: Self-pay | Admitting: Family Medicine

## 2023-01-31 ENCOUNTER — Ambulatory Visit: Payer: PPO

## 2023-01-31 ENCOUNTER — Telehealth: Payer: Self-pay

## 2023-01-31 MED ORDER — NIRMATRELVIR/RITONAVIR (PAXLOVID)TABLET: 3.0000 | ORAL_TABLET | Freq: Two times a day (BID) | ORAL | 0 refills | Status: AC

## 2023-01-31 NOTE — Telephone Encounter (Signed)
Called patient and patient stated that pharmacy informed him not to take Statin while taking Paxlovid and to check with PCP about taking BP medications while taking paxlovid. Consulted with Dr Tanya Nones and advised per Dr Tanya Nones discontinue taking statin for 5 days then restart. It is okay to continue taking blood pressure medications. Patient verbalized understanding.

## 2023-01-31 NOTE — Telephone Encounter (Signed)
Patient called back to specify the pharmacist told him to stop the cholesterol medication and inquire about whether or not to stop taking the blood pressure medication. Patient advised provider is in with a patient and the nurse will call him back with the provider's feedback.  Please advise at [phone].

## 2023-02-01 ENCOUNTER — Encounter: Payer: PPO | Admitting: Family Medicine

## 2023-02-04 ENCOUNTER — Ambulatory Visit: Payer: PPO | Admitting: Family Medicine

## 2023-02-04 ENCOUNTER — Encounter: Payer: Self-pay | Admitting: Family Medicine

## 2023-02-04 VITALS — BP 120/72 | HR 66 | Temp 98.7°F | Ht 72.0 in | Wt 183.4 lb

## 2023-02-04 DIAGNOSIS — E7800 Pure hypercholesterolemia, unspecified: Secondary | ICD-10-CM

## 2023-02-04 DIAGNOSIS — Z0001 Encounter for general adult medical examination with abnormal findings: Secondary | ICD-10-CM | POA: Diagnosis not present

## 2023-02-04 DIAGNOSIS — I10 Essential (primary) hypertension: Secondary | ICD-10-CM

## 2023-02-04 DIAGNOSIS — Z0000 Encounter for general adult medical examination without abnormal findings: Secondary | ICD-10-CM

## 2023-02-04 NOTE — Progress Notes (Signed)
Subjective:    Patient ID: Daniel Rios, male    DOB: 1955/07/01, 67 y.o.   MRN: [id number]  HPI  Patient is a very pleasant 67 year old Caucasian gentleman who presents today for complete physical exam.  His last colonoscopy was in 2021.  GI recommended a repeat colonoscopy in 2028.  His last PSA was in January of this year.  It was stable.  He is due again for the PSA test in January 2025.  He is due for shingles vaccine, flu shot.  Presently he is sick with COVID so he defers the vaccinations at the present time.  He is also about to have a new grandchild this fall.  We recommended an RSV vaccine once he is feeling better.  Otherwise he is doing well.  Blood pressure today is outstanding at 120/70.Marland Kitchen  Most recent blood work is listed below: Lab on 01/28/2023  Component Date Value Ref Range Status   WBC 01/28/2023 6.7  3.8 - 10.8 Thousand/uL Final   RBC 01/28/2023 4.79  4.20 - 5.80 Million/uL Final   Hemoglobin 01/28/2023 15.8  13.2 - 17.1 g/dL Final   HCT 40/98/1191 47.1  38.5 - 50.0 % Final   MCV 01/28/2023 98.3  80.0 - 100.0 fL Final   MCH 01/28/2023 33.0  27.0 - 33.0 pg Final   MCHC 01/28/2023 33.5  32.0 - 36.0 g/dL Final   RDW 47/82/9562 12.2  11.0 - 15.0 % Final   Platelets 01/28/2023 300  140 - 400 Thousand/uL Final   MPV 01/28/2023 9.6  7.5 - 12.5 fL Final   Neutro Abs 01/28/2023 4,060  1,500 - 7,800 cells/uL Final   Lymphs Abs 01/28/2023 1,843  850 - 3,900 cells/uL Final   Absolute Monocytes 01/28/2023 590  200 - 950 cells/uL Final   Eosinophils Absolute 01/28/2023 147  15 - 500 cells/uL Final   Basophils Absolute 01/28/2023 60  0 - 200 cells/uL Final   Neutrophils Relative % 01/28/2023 60.6  % Final   Total Lymphocyte 01/28/2023 27.5  % Final   Monocytes Relative 01/28/2023 8.8  % Final   Eosinophils Relative 01/28/2023 2.2  % Final   Basophils Relative 01/28/2023 0.9  % Final   Glucose, Bld 01/28/2023 113 (H)  65 - 99 mg/dL Final   Comment: .            Fasting  reference interval . For someone without known diabetes, a glucose value between 100 and 125 mg/dL is consistent with prediabetes and should be confirmed with a follow-up test. .    BUN 01/28/2023 17  7 - 25 mg/dL Final   Creat 13/12/6576 1.18  0.70 - 1.35 mg/dL Final   eGFR 46/96/2952 67  > OR = 60 mL/min/1.29m2 Final   BUN/Creatinine Ratio 01/28/2023 SEE NOTE:  6 - 22 (calc) Final   Comment:    Not Reported: BUN and Creatinine are within    reference range. .    Sodium 01/28/2023 139  135 - 146 mmol/L Final   Potassium 01/28/2023 4.8  3.5 - 5.3 mmol/L Final   Chloride 01/28/2023 105  98 - 110 mmol/L Final   CO2 01/28/2023 28  20 - 32 mmol/L Final   Calcium 01/28/2023 9.5  8.6 - 10.3 mg/dL Final   Total Protein 84/13/2440 6.7  6.1 - 8.1 g/dL Final   Albumin 03/17/2535 4.2  3.6 - 5.1 g/dL Final   Globulin 64/40/3474 2.5  1.9 - 3.7 g/dL (calc) Final   AG Ratio 01/28/2023 1.7  1.0 - 2.5 (calc) Final   Total Bilirubin 01/28/2023 0.5  0.2 - 1.2 mg/dL Final   Alkaline phosphatase (APISO) 01/28/2023 64  35 - 144 U/L Final   AST 01/28/2023 26  10 - 35 U/L Final   ALT 01/28/2023 22  9 - 46 U/L Final   Cholesterol 01/28/2023 150  <200 mg/dL Final   HDL 16/02/9603 51  > OR = 40 mg/dL Final   Triglycerides 54/01/8118 65  <150 mg/dL Final   LDL Cholesterol (Calc) 01/28/2023 85  mg/dL (calc) Final   Comment: Reference range: <100 . Desirable range <100 mg/dL for primary prevention;   <70 mg/dL for patients with CHD or diabetic patients  with > or = 2 CHD risk factors. Marland Kitchen LDL-C is now calculated using the Martin-Hopkins  calculation, which is a validated novel method providing  better accuracy than the Friedewald equation in the  estimation of LDL-C.  Horald Pollen et al. Lenox Ahr. 1478;295(62): 2061-2068  (http://education.[facility].com/faq/FAQ164)    Total CHOL/HDL Ratio 01/28/2023 2.9  <1.3 (calc) Final   Non-HDL Cholesterol (Calc) 01/28/2023 99  <130 mg/dL (calc) Final   Comment:  For patients with diabetes plus 1 major ASCVD risk  factor, treating to a non-HDL-C goal of <100 mg/dL  (LDL-C of <08 mg/dL) is considered a therapeutic  option.     Past Medical History:  Diagnosis Date   Allergy    Hyperlipidemia    Hypertension    Strabismus    Past Surgical History:  Procedure Laterality Date   COLONOSCOPY  11-01-2003   normal   COLONOSCOPY  06/21/2014   1-TA   FOOT SURGERY Right 2019   Current Outpatient Medications on File Prior to Visit  Medication Sig Dispense Refill   amLODipine (NORVASC) 5 MG tablet TAKE 1 TABLET(5 MG) BY MOUTH DAILY 90 tablet 1   losartan (COZAAR) 100 MG tablet TAKE 1 TABLET BY MOUTH DAILY 90 tablet 0   Multiple Vitamin (MULTIVITAMIN) tablet Take 1 tablet by mouth daily. One a day 50+     nirmatrelvir/ritonavir (PAXLOVID) 20 x 150 MG & 10 x 100MG  TABS Take 3 tablets by mouth 2 (two) times daily for 5 days. (Take nirmatrelvir 150 mg two tablets twice daily for 5 days and ritonavir 100 mg one tablet twice daily for 5 days) 30 tablet 0   rosuvastatin (CRESTOR) 20 MG tablet TAKE 1 TABLET(20 MG) BY MOUTH DAILY 90 tablet 0   tamsulosin (FLOMAX) 0.4 MG CAPS capsule TAKE 1 CAPSULE BY MOUTH EVERY NIGHT AT BEDTIME 90 capsule 0   No current facility-administered medications on file prior to visit.     Allergies  Allergen Reactions   Celebrex [Celecoxib] Swelling and Rash   Social History   Socioeconomic History   Marital status: Married    Spouse name: Not on file   Number of children: Not on file   Years of education: Not on file   Highest education level: 12th grade  Occupational History   Not on file  Tobacco Use   Smoking status: Former    Current packs/day: 0.00    Types: Cigarettes    Quit date: 09/21/1980    Years since quitting: 42.4   Smokeless tobacco: Never  Vaping Use   Vaping status: Never Used  Substance and Sexual Activity   Alcohol use: Yes   Drug use: No   Sexual activity: Not on file    Comment: married,  quit smoking in 1982.    Other Topics Concern   Not  on file  Social History Narrative   Not on file   Social Determinants of Health   Financial Resource Strain: Low Risk  (02/03/2023)   Overall Financial Resource Strain (CARDIA)    Difficulty of Paying Living Expenses: Not very hard  Food Insecurity: No Food Insecurity (02/03/2023)   Hunger Vital Sign    Worried About Running Out of Food in the Last Year: Never true    Ran Out of Food in the Last Year: Never true  Transportation Needs: No Transportation Needs (02/03/2023)   PRAPARE - Administrator, Civil Service (Medical): No    Lack of Transportation (Non-Medical): No  Physical Activity: Unknown (02/03/2023)   Exercise Vital Sign    Days of Exercise per Week: 6 days    Minutes of Exercise per Session: Patient declined  Stress: No Stress Concern Present (02/03/2023)   [employer] of Occupational Health - Occupational Stress Questionnaire    Feeling of Stress : Only a little  Social Connections: Unknown (02/03/2023)   Social Connection and Isolation Panel [NHANES]    Frequency of Communication with Friends and Family: Patient declined    Frequency of Social Gatherings with Friends and Family: Patient declined    Attends Religious Services: Patient declined    Database administrator or Organizations: Patient declined    Attends Engineer, structural: More than 4 times per year    Marital Status: Married  Catering manager Violence: Not At Risk (09/06/2022)   Humiliation, Afraid, Rape, and Kick questionnaire    Fear of Current or Ex-Partner: No    Emotionally Abused: No    Physically Abused: No    Sexually Abused: No     Review of Systems  All other systems reviewed and are negative.      Objective:   Physical Exam Vitals reviewed.  Constitutional:      General: He is not in acute distress.    Appearance: He is well-developed. He is not diaphoretic.  HENT:     Head: Normocephalic and atraumatic.      Right Ear: External ear normal.     Left Ear: External ear normal.     Nose: Nose normal.     Mouth/Throat:     Pharynx: No oropharyngeal exudate.  Eyes:     General: No scleral icterus.       Right eye: No discharge.        Left eye: No discharge.     Conjunctiva/sclera: Conjunctivae normal.     Pupils: Pupils are equal, round, and reactive to light.  Neck:     Thyroid: No thyromegaly.     Vascular: No JVD.     Trachea: No tracheal deviation.  Cardiovascular:     Rate and Rhythm: Normal rate and regular rhythm.     Heart sounds: Normal heart sounds. No murmur heard.    No friction rub. No gallop.  Pulmonary:     Effort: Pulmonary effort is normal. No respiratory distress.     Breath sounds: Normal breath sounds. No stridor. No wheezing or rales.  Chest:     Chest wall: No tenderness.  Abdominal:     General: Bowel sounds are normal. There is no distension.     Palpations: Abdomen is soft. There is no mass.     Tenderness: There is no abdominal tenderness. There is no guarding or rebound.  Musculoskeletal:        General: No tenderness. Normal range of motion.  Cervical back: Normal range of motion and neck supple.  Lymphadenopathy:     Cervical: No cervical adenopathy.  Skin:    General: Skin is warm.     Coloration: Skin is not pale.     Findings: No erythema or rash.  Neurological:     Mental Status: He is alert and oriented to person, place, and time.     Cranial Nerves: No cranial nerve deficit.     Motor: No abnormal muscle tone.     Coordination: Coordination normal.     Deep Tendon Reflexes: Reflexes are normal and symmetric.  Psychiatric:        Behavior: Behavior normal.        Thought Content: Thought content normal.        Judgment: Judgment normal.           Assessment & Plan:  Benign essential HTN  Pure hypercholesterolemia  General medical exam Blood pressure today is excellent.  Cholesterol is outstanding.  He is due for his PSA in  January.  His blood sugar was mildly elevated at a prediabetic level we will continue to follow with a colonoscopy as of today.  Commended RSV, shingles vaccine, and a flu shot when the patient recovers from COVID.  The rest of his preventative care including his colonoscopy is up-to-date.  He denies any history of falls, depression, or memory loss.

## 2023-02-05 ENCOUNTER — Other Ambulatory Visit: Payer: Self-pay | Admitting: Family Medicine

## 2023-02-05 DIAGNOSIS — E7800 Pure hypercholesterolemia, unspecified: Secondary | ICD-10-CM

## 2023-02-06 NOTE — Telephone Encounter (Signed)
Requested by interface surescripts. Last OV 02/04/23.  Requested Prescriptions  Pending Prescriptions Disp Refills   rosuvastatin (CRESTOR) 20 MG tablet [Pharmacy Med Name: ROSUVASTATIN 20MG  TABLETS] 90 tablet 0    Sig: TAKE 1 TABLET(20 MG) BY MOUTH DAILY     Cardiovascular:  Antilipid - Statins 2 Failed - 02/06/2023 12:28 PM      Failed - Valid encounter within last 12 months    Recent Outpatient Visits           1 year ago Benign essential HTN   [facility] Pickard, Priscille Heidelberg, MD   2 years ago General medical exam   [facility] Donita Brooks, MD   2 years ago Bacterial sinusitis   [facility] Valentino Nose, NP   2 years ago COVID-19   [facility] Pickard, Priscille Heidelberg, MD   2 years ago COVID-19   [facility] Tanya Nones, Priscille Heidelberg, MD              Failed - Lipid Panel in normal range within the last 12 months    Cholesterol  Date Value Ref Range Status  01/28/2023 150 <200 mg/dL Final   LDL Cholesterol (Calc)  Date Value Ref Range Status  01/28/2023 85 mg/dL (calc) Final    Comment:    Reference range: <100 . Desirable range <100 mg/dL for primary prevention;   <70 mg/dL for patients with CHD or diabetic patients  with > or = 2 CHD risk factors. Marland Kitchen LDL-C is now calculated using the Martin-Hopkins  calculation, which is a validated novel method providing  better accuracy than the Friedewald equation in the  estimation of LDL-C.  Horald Pollen et al. Lenox Ahr. 9604;540(98): 2061-2068  (http://education.[facility].com/faq/FAQ164)    HDL  Date Value Ref Range Status  01/28/2023 51 > OR = 40 mg/dL Final   Triglycerides  Date Value Ref Range Status  01/28/2023 65 <150 mg/dL Final         Passed - Cr in normal range and within 360 days    Creat  Date Value Ref Range Status  01/28/2023 1.18 0.70 - 1.35 mg/dL Final   Creatinine, Urine  Date Value Ref Range Status  05/22/2020  87.05 mg/dL Final         Passed - Patient is not pregnant       tamsulosin (FLOMAX) 0.4 MG CAPS capsule [Pharmacy Med Name: TAMSULOSIN 0.4MG  CAPSULES] 90 capsule 0    Sig: TAKE 1 CAPSULE BY MOUTH EVERY NIGHT AT BEDTIME     Urology: Alpha-Adrenergic Blocker Failed - 02/06/2023 12:28 PM      Failed - Valid encounter within last 12 months    Recent Outpatient Visits           1 year ago Benign essential HTN   [facility] Donita Brooks, MD   2 years ago General medical exam   [facility] Donita Brooks, MD   2 years ago Bacterial sinusitis   [facility] Valentino Nose, NP   2 years ago COVID-19   [facility] Pickard, Priscille Heidelberg, MD   2 years ago COVID-19   [facility] Pickard, Priscille Heidelberg, MD              Passed - PSA in normal range and within 360 days    PSA  Date Value Ref Range Status  05/29/2022 1.60 <  OR = 4.00 ng/mL Final    Comment:    The total PSA value from this assay system is  standardized against the WHO standard. The test  result will be approximately 20% lower when compared  to the equimolar-standardized total PSA (Beckman  Coulter). Comparison of serial PSA results should be  interpreted with this fact in mind. . This test was performed using the Siemens  chemiluminescent method. Values obtained from  different assay methods cannot be used interchangeably. PSA levels, regardless of value, should not be interpreted as absolute evidence of the presence or absence of disease.          Passed - Last BP in normal range    BP Readings from Last 1 Encounters:  02/04/23 120/72

## 2023-05-08 ENCOUNTER — Other Ambulatory Visit: Payer: Self-pay | Admitting: Family Medicine

## 2023-05-08 DIAGNOSIS — E7800 Pure hypercholesterolemia, unspecified: Secondary | ICD-10-CM

## 2023-05-28 ENCOUNTER — Telehealth: Payer: Self-pay | Admitting: Family Medicine

## 2023-05-28 NOTE — Telephone Encounter (Signed)
 Patient requesting for his pharmacy to be permanently changed to   [facility] [address] [place], [place] Phone: [phone]

## 2023-06-03 ENCOUNTER — Other Ambulatory Visit: Payer: Self-pay | Admitting: Family Medicine

## 2023-06-03 DIAGNOSIS — I10 Essential (primary) hypertension: Secondary | ICD-10-CM

## 2023-06-03 DIAGNOSIS — E7800 Pure hypercholesterolemia, unspecified: Secondary | ICD-10-CM

## 2023-06-03 MED ORDER — AMLODIPINE BESYLATE 5 MG PO TABS: ORAL_TABLET | ORAL | 1 refills | Status: DC

## 2023-06-03 MED ORDER — ONE-DAILY MULTI VITAMINS PO TABS: 1.0000 | ORAL_TABLET | Freq: Every day | ORAL | 1 refills | Status: AC

## 2023-06-03 MED ORDER — ROSUVASTATIN CALCIUM 20 MG PO TABS: ORAL_TABLET | ORAL | 1 refills | Status: DC

## 2023-06-03 MED ORDER — LOSARTAN POTASSIUM 100 MG PO TABS
ORAL_TABLET | ORAL | 1 refills | Status: DC
Start: 2023-06-03 — End: 2023-11-21

## 2023-06-03 MED ORDER — TAMSULOSIN HCL 0.4 MG PO CAPS: 0.4000 mg | ORAL_CAPSULE | Freq: Every day | ORAL | 1 refills | Status: DC

## 2023-06-03 NOTE — Telephone Encounter (Signed)
 Copied from CRM [id number]. Topic: Clinical - Medication Refill >> Jun 03, 2023  9:56 AM Farrel B wrote: Most Recent Primary Care Visit:  Provider: DUANNE LOWERS T  Department: [facility]  Visit Type: PHYSICAL  Date: 02/04/2023  Medication: amLODipine  (NORVASC ) 5 MG tablet  losartan  (COZAAR ) 100 MG tablet Multiple Vitamin (MULTIVITAMIN) tablet  rosuvastatin  (CRESTOR ) 20 MG tablet tamsulosin  (FLOMAX ) 0.4 MG CAPS capsule   Has the patient contacted their pharmacy? Yes (Agent: If no, request that the patient contact the pharmacy for the refill. If patient does not wish to contact the pharmacy document the reason why and proceed with request.) (Agent: If yes, when and what did the pharmacy advise?)He called his previous pharmacy [facility] to have them transferred over to his new pharmacy due to insurance changes, when he contacted [facility] they advised him to have the other pharmacy called and was informed hey didn't have them anymore and needed to call in to request refills.  Patient stated if possible he would like them for 90 day refills as it was previously  Is this the correct pharmacy for this prescription? Yes If no, delete pharmacy and type the correct one.  This is the patient's preferred pharmacy:  [facility] [place], [place] - [id number] [place] [address] Phone: [phone] Fax: [phone]   Has the prescription been filled recently? Yes  Is the patient out of the medication? Yes  Has the patient been seen for an appointment in the last year OR does the patient have an upcoming appointment? Yes  Can we respond through MyChart? Yes  Agent: Please be advised that Rx refills may take up to 3 business days. We ask that you follow-up with your pharmacy.

## 2023-07-04 ENCOUNTER — Encounter: Payer: Self-pay | Admitting: Emergency Medicine

## 2023-07-04 ENCOUNTER — Ambulatory Visit
Admission: EM | Admit: 2023-07-04 | Discharge: 2023-07-04 | Disposition: A | Discharge status: Home / Self Care | Payer: HMO | Attending: Family Medicine | Admitting: Family Medicine

## 2023-07-04 DIAGNOSIS — J209 Acute bronchitis, unspecified: Secondary | ICD-10-CM

## 2023-07-04 MED ORDER — DEXAMETHASONE SODIUM PHOSPHATE 10 MG/ML IJ SOLN
10.0000 mg | Freq: Once | INTRAMUSCULAR | Status: AC
  Administered 2023-07-04: 10 mg via INTRAMUSCULAR

## 2023-07-04 MED ORDER — PROMETHAZINE-DM 6.25-15 MG/5ML PO SYRP: 5.0000 mL | ORAL_SOLUTION | Freq: Four times a day (QID) | ORAL | 0 refills | Status: DC | PRN

## 2023-07-04 MED ORDER — GUAIFENESIN ER 600 MG PO TB12: 600.0000 mg | ORAL_TABLET | Freq: Two times a day (BID) | ORAL | 0 refills | Status: DC

## 2023-07-04 MED ORDER — ALBUTEROL SULFATE HFA 108 (90 BASE) MCG/ACT IN AERS: 2.0000 | INHALATION_SPRAY | RESPIRATORY_TRACT | 0 refills | Status: DC | PRN

## 2023-07-04 NOTE — ED Provider Notes (Signed)
[facility]    CSN: [id number] Arrival date & time: 07/04/23  1028      History   Chief Complaint No chief complaint on file.   HPI Daniel Rios is a 68 y.o. male.   Presenting today with a day history of congestion, wheezing, chest tightness, hacking cough.  Denies fever, chills, chest pain, shortness of breath, abdominal pain, vomiting, diarrhea.  So far trying nasal spray, cough syrup with minimal relief.  History of seasonal allergies, no known history of chronic pulmonary disease.    Past Medical History:  Diagnosis Date   Allergy    Hyperlipidemia    Hypertension    Strabismus     Patient Active Problem List   Diagnosis Date Noted   Strabismus    Hyperlipidemia    Hypertension     Past Surgical History:  Procedure Laterality Date   COLONOSCOPY  11-01-2003   normal   COLONOSCOPY  06/21/2014   1-TA   FOOT SURGERY Right 2019       Home Medications    Prior to Admission medications   Medication Sig Start Date End Date Taking? Authorizing Provider  albuterol (VENTOLIN HFA) 108 (90 Base) MCG/ACT inhaler Inhale 2 puffs into the lungs every 4 (four) hours as needed. 07/04/23  Yes Particia Nearing, PA-C  guaiFENesin (MUCINEX) 600 MG 12 hr tablet Take 1 tablet (600 mg total) by mouth 2 (two) times daily. 07/04/23  Yes Particia Nearing, PA-C  promethazine-dextromethorphan (PROMETHAZINE-DM) 6.25-15 MG/5ML syrup Take 5 mLs by mouth 4 (four) times daily as needed. 07/04/23  Yes Particia Nearing, PA-C  amLODipine (NORVASC) 5 MG tablet TAKE 1 TABLET(5 MG) BY MOUTH DAILY 06/03/23   Park Meo, FNP  losartan (COZAAR) 100 MG tablet TAKE 1 TABLET BY MOUTH DAILY 06/03/23   Park Meo, FNP  Multiple Vitamin (MULTIVITAMIN) tablet Take 1 tablet by mouth daily. One a day 50+ 06/03/23   Park Meo, FNP  rosuvastatin (CRESTOR) 20 MG tablet TAKE 1 TABLET(20 MG) BY MOUTH DAILY 06/03/23   Park Meo, FNP  tamsulosin (FLOMAX) 0.4 MG CAPS  capsule Take 1 capsule (0.4 mg total) by mouth at bedtime. 06/03/23   Park Meo, FNP    Family History Family History  Problem Relation Age of Onset   Arthritis Mother    Cancer Father        had metas.all over when found-? starting point    COPD Brother    Colon cancer Maternal Aunt    Colon polyps Neg Hx    Esophageal cancer Neg Hx    Rectal cancer Neg Hx    Stomach cancer Neg Hx     Social History Social History   Tobacco Use   Smoking status: Former    Current packs/day: 0.00    Types: Cigarettes    Quit date: 09/21/1980    Years since quitting: 42.8   Smokeless tobacco: Never  Vaping Use   Vaping status: Never Used  Substance Use Topics   Alcohol use: Yes   Drug use: No     Allergies   Celebrex [celecoxib]   Review of Systems Review of Systems PER HPI  Physical Exam Triage Vital Signs ED Triage Vitals  Encounter Vitals Group     BP 07/04/23 1130 (!) 155/80     Systolic BP Percentile --      Diastolic BP Percentile --      Pulse Rate 07/04/23 1130 75  Resp 07/04/23 1130 18     Temp 07/04/23 1130 98 F (36.7 C)     Temp Source 07/04/23 1130 Oral     SpO2 07/04/23 1130 94 %     Weight --      Height --      Head Circumference --      Peak Flow --      Pain Score 07/04/23 1131 0     Pain Loc --      Pain Education --      Exclude from Growth Chart --    No data found.  Updated Vital Signs BP (!) 155/80 (BP Location: Right Arm)   Pulse 75   Temp 98 F (36.7 C) (Oral)   Resp 18   SpO2 94%   Visual Acuity Right Eye Distance:   Left Eye Distance:   Bilateral Distance:    Right Eye Near:   Left Eye Near:    Bilateral Near:     Physical Exam Vitals and nursing note reviewed.  Constitutional:      Appearance: He is well-developed.  HENT:     Head: Atraumatic.     Right Ear: External ear normal.     Left Ear: External ear normal.     Nose: Nose normal.     Mouth/Throat:     Mouth: Mucous membranes are moist.     Pharynx:  Oropharynx is clear. No oropharyngeal exudate or posterior oropharyngeal erythema.  Eyes:     Conjunctiva/sclera: Conjunctivae normal.     Pupils: Pupils are equal, round, and reactive to light.  Cardiovascular:     Rate and Rhythm: Normal rate and regular rhythm.  Pulmonary:     Effort: Pulmonary effort is normal. No respiratory distress.     Breath sounds: Wheezing present. No rales.     Comments: Very scant wheeze to bases Musculoskeletal:        General: Normal range of motion.     Cervical back: Normal range of motion and neck supple.  Lymphadenopathy:     Cervical: No cervical adenopathy.  Skin:    General: Skin is warm and dry.  Neurological:     Mental Status: He is alert and oriented to person, place, and time.  Psychiatric:        Behavior: Behavior normal.      UC Treatments / Results  Labs (all labs ordered are listed, but only abnormal results are displayed) Labs Reviewed - No data to display  EKG   Radiology No results found.  Procedures Procedures (including critical care time)  Medications Ordered in UC Medications  dexamethasone (DECADRON) injection 10 mg (has no administration in time range)    Initial Impression / Assessment and Plan / UC Course  I have reviewed the triage vital signs and the nursing notes.  Pertinent labs & imaging results that were available during my care of the patient were reviewed by me and considered in my medical decision making (see chart for details).     Suspect postviral cough/bronchitis.  No evidence of secondary bacterial infection today.  Treat with IM Decadron, albuterol, Phenergan DM, Mucinex, supportive over-the-counter medications and home care.  Return for worsening symptoms.  Final Clinical Impressions(s) / UC Diagnoses   Final diagnoses:  Acute bronchitis, unspecified organism   Discharge Instructions   None    ED Prescriptions     Medication Sig Dispense Auth. Provider   albuterol (VENTOLIN  HFA) 108 (90 Base) MCG/ACT inhaler Inhale 2 puffs  into the lungs every 4 (four) hours as needed. 18 g Roosvelt Maser [place], [place]   promethazine-dextromethorphan (PROMETHAZINE-DM) 6.25-15 MG/5ML syrup Take 5 mLs by mouth 4 (four) times daily as needed. 100 mL Particia Nearing, PA-C   guaiFENesin (MUCINEX) 600 MG 12 hr tablet Take 1 tablet (600 mg total) by mouth 2 (two) times daily. 20 tablet Particia Nearing, [place]      PDMP not reviewed this encounter.   Particia Nearing, [place] 07/04/23 1157

## 2023-07-04 NOTE — ED Triage Notes (Signed)
Nasal congestion and chest congestion, wheezing x 8 days.  Has been taking over the counter cough medications with little relief.

## 2023-08-06 ENCOUNTER — Other Ambulatory Visit: Payer: Self-pay | Admitting: Family Medicine

## 2023-08-06 DIAGNOSIS — E7800 Pure hypercholesterolemia, unspecified: Secondary | ICD-10-CM

## 2023-08-07 NOTE — Telephone Encounter (Signed)
 Too soon for refill.  Requested Prescriptions  Pending Prescriptions Disp Refills   rosuvastatin (CRESTOR) 20 MG tablet [Pharmacy Med Name: ROSUVASTATIN 20MG  TABLETS] 90 tablet 1    Sig: TAKE 1 TABLET(20 MG) BY MOUTH DAILY     Cardiovascular:  Antilipid - Statins 2 Failed - 08/07/2023 12:06 PM      Failed - Valid encounter within last 12 months    Recent Outpatient Visits           2 years ago Benign essential HTN   [facility] Pickard, Priscille Heidelberg, MD   2 years ago General medical exam   [facility] Donita Brooks, MD   2 years ago Bacterial sinusitis   [facility] Valentino Nose, NP   3 years ago COVID-19   [facility] Pickard, Priscille Heidelberg, MD   3 years ago COVID-19   [facility] Pickard, Priscille Heidelberg, MD              Failed - Lipid Panel in normal range within the last 12 months    Cholesterol  Date Value Ref Range Status  01/28/2023 150 <200 mg/dL Final   LDL Cholesterol (Calc)  Date Value Ref Range Status  01/28/2023 85 mg/dL (calc) Final    Comment:    Reference range: <100 . Desirable range <100 mg/dL for primary prevention;   <70 mg/dL for patients with CHD or diabetic patients  with > or = 2 CHD risk factors. Marland Kitchen LDL-C is now calculated using the Martin-Hopkins  calculation, which is a validated novel method providing  better accuracy than the Friedewald equation in the  estimation of LDL-C.  Horald Pollen et al. Lenox Ahr. 9562;130(86): 2061-2068  (http://education.[facility].com/faq/FAQ164)    HDL  Date Value Ref Range Status  01/28/2023 51 > OR = 40 mg/dL Final   Triglycerides  Date Value Ref Range Status  01/28/2023 65 <150 mg/dL Final         Passed - Cr in normal range and within 360 days    Creat  Date Value Ref Range Status  01/28/2023 1.18 0.70 - 1.35 mg/dL Final   Creatinine, Urine  Date Value Ref Range Status  05/22/2020 87.05 mg/dL Final          Passed - Patient is not pregnant       tamsulosin (FLOMAX) 0.4 MG CAPS capsule [Pharmacy Med Name: TAMSULOSIN 0.4MG  CAPSULES] 90 capsule 1    Sig: TAKE 1 CAPSULE BY MOUTH EVERY NIGHT AT BEDTIME     Urology: Alpha-Adrenergic Blocker Failed - 08/07/2023 12:06 PM      Failed - PSA in normal range and within 360 days    PSA  Date Value Ref Range Status  05/29/2022 1.60 < OR = 4.00 ng/mL Final    Comment:    The total PSA value from this assay system is  standardized against the WHO standard. The test  result will be approximately 20% lower when compared  to the equimolar-standardized total PSA (Beckman  Coulter). Comparison of serial PSA results should be  interpreted with this fact in mind. . This test was performed using the Siemens  chemiluminescent method. Values obtained from  different assay methods cannot be used interchangeably. PSA levels, regardless of value, should not be interpreted as absolute evidence of the presence or absence of disease.          Failed - Last BP in normal range    BP Readings  from Last 1 Encounters:  07/04/23 (!) 155/80         Failed - Valid encounter within last 12 months    Recent Outpatient Visits           2 years ago Benign essential HTN   [facility] Tanya Nones, Priscille Heidelberg, MD   2 years ago General medical exam   [facility] Donita Brooks, MD   2 years ago Bacterial sinusitis   [facility] Valentino Nose, NP   3 years ago COVID-19   [facility] Pickard, Priscille Heidelberg, MD   3 years ago COVID-19   [facility] Pickard, Priscille Heidelberg, MD

## 2023-09-05 DIAGNOSIS — D225 Melanocytic nevi of trunk: Secondary | ICD-10-CM | POA: Diagnosis not present

## 2023-09-05 DIAGNOSIS — Z85828 Personal history of other malignant neoplasm of skin: Secondary | ICD-10-CM | POA: Diagnosis not present

## 2023-09-05 DIAGNOSIS — D2271 Melanocytic nevi of right lower limb, including hip: Secondary | ICD-10-CM | POA: Diagnosis not present

## 2023-09-05 DIAGNOSIS — L570 Actinic keratosis: Secondary | ICD-10-CM | POA: Diagnosis not present

## 2023-09-05 DIAGNOSIS — D2272 Melanocytic nevi of left lower limb, including hip: Secondary | ICD-10-CM | POA: Diagnosis not present

## 2023-09-05 DIAGNOSIS — D485 Neoplasm of uncertain behavior of skin: Secondary | ICD-10-CM | POA: Diagnosis not present

## 2023-09-05 DIAGNOSIS — D2262 Melanocytic nevi of left upper limb, including shoulder: Secondary | ICD-10-CM | POA: Diagnosis not present

## 2023-09-05 DIAGNOSIS — L821 Other seborrheic keratosis: Secondary | ICD-10-CM | POA: Diagnosis not present

## 2023-09-05 DIAGNOSIS — C44519 Basal cell carcinoma of skin of other part of trunk: Secondary | ICD-10-CM | POA: Diagnosis not present

## 2023-09-05 DIAGNOSIS — D2261 Melanocytic nevi of right upper limb, including shoulder: Secondary | ICD-10-CM | POA: Diagnosis not present

## 2023-09-12 ENCOUNTER — Ambulatory Visit: Payer: PPO | Admitting: *Deleted

## 2023-09-12 DIAGNOSIS — Z0000 Encounter for general adult medical examination without abnormal findings: Secondary | ICD-10-CM | POA: Diagnosis not present

## 2023-09-12 NOTE — Progress Notes (Signed)
 Subjective:   Daniel Rios is a 68 y.o. male who presents for Medicare Annual/Subsequent preventive examination.  Visit Complete: Virtual I connected with  [name] on 09/12/23 by a audio enabled telemedicine application and verified that I am speaking with the correct person using two identifiers.  Patient Location: Home  Provider Location: Home Office  I discussed the limitations of evaluation and management by telemedicine. The patient expressed understanding and agreed to proceed.  Vital Signs: Because this visit was a virtual/telehealth visit, some criteria may be missing or patient reported. Any vitals not documented were not able to be obtained and vitals that have been documented are patient reported.  Patient Medicare AWV questionnaire was completed by the patient on 09-11-2023; I have confirmed that all information answered by patient is correct and no changes since this date.  Cardiac Risk Factors include: advanced age (>15men, >60 women);hypertension;male gender     Objective:    There were no vitals filed for this visit. There is no height or weight on file to calculate BMI.     09/12/2023    8:27 AM 09/06/2022    9:46 AM 10/05/2021    3:44 PM 09/29/2020    8:20 AM 05/23/2020    4:34 PM 05/22/2020   12:19 PM 02/07/2017   10:29 AM  Advanced Directives  Does Patient Have a Medical Advance Directive? Yes Yes No No  No No  Type of Advance Directive Healthcare Power of Attorney Living will;Healthcare Power of Attorney       Does patient want to make changes to medical advance directive?  No - Patient declined       Copy of Healthcare Power of Attorney in Chart? No - copy requested No - copy requested       Would patient like information on creating a medical advance directive?   No - Patient declined No - Patient declined No - Patient declined  No - Patient declined    Current Medications (verified) Outpatient Encounter Medications as of 09/12/2023  Medication Sig    amLODipine  (NORVASC ) 5 MG tablet TAKE 1 TABLET(5 MG) BY MOUTH DAILY   losartan  (COZAAR ) 100 MG tablet TAKE 1 TABLET BY MOUTH DAILY   Multiple Vitamin (MULTIVITAMIN) tablet Take 1 tablet by mouth daily. One a day 50+   rosuvastatin  (CRESTOR ) 20 MG tablet TAKE 1 TABLET(20 MG) BY MOUTH DAILY   tamsulosin  (FLOMAX ) 0.4 MG CAPS capsule Take 1 capsule (0.4 mg total) by mouth at bedtime.   albuterol  (VENTOLIN  HFA) 108 (90 Base) MCG/ACT inhaler Inhale 2 puffs into the lungs every 4 (four) hours as needed.   guaiFENesin  (MUCINEX ) 600 MG 12 hr tablet Take 1 tablet (600 mg total) by mouth 2 (two) times daily.   promethazine -dextromethorphan (PROMETHAZINE -DM) 6.25-15 MG/5ML syrup Take 5 mLs by mouth 4 (four) times daily as needed.   No facility-administered encounter medications on file as of 09/12/2023.    Allergies (verified) Celebrex  [celecoxib ]   History: Past Medical History:  Diagnosis Date   Allergy    Hyperlipidemia    Hypertension    Strabismus    Past Surgical History:  Procedure Laterality Date   COLONOSCOPY  11-01-2003   normal   COLONOSCOPY  06/21/2014   1-TA   FOOT SURGERY Right 2019   Family History  Problem Relation Age of Onset   Arthritis Mother    Cancer Father        had metas.all over when found-? starting point    COPD  Brother    Colon cancer Maternal Aunt    Colon polyps Neg Hx    Esophageal cancer Neg Hx    Rectal cancer Neg Hx    Stomach cancer Neg Hx    Social History   Socioeconomic History   Marital status: Married    Spouse name: Not on file   Number of children: Not on file   Years of education: Not on file   Highest education level: 12th grade  Occupational History   Not on file  Tobacco Use   Smoking status: Former    Current packs/day: 0.00    Types: Cigarettes    Quit date: 09/21/1980    Years since quitting: 43.0   Smokeless tobacco: Never  Vaping Use   Vaping status: Never Used  Substance and Sexual Activity   Alcohol use: Yes    Drug use: No   Sexual activity: Not Currently    Comment: married, quit smoking in 1982.    Other Topics Concern   Not on file  Social History Narrative   Not on file   Social Drivers of Health   Financial Resource Strain: Low Risk  (09/12/2023)   Overall Financial Resource Strain (CARDIA)    Difficulty of Paying Living Expenses: Not very hard  Food Insecurity: No Food Insecurity (09/12/2023)   Hunger Vital Sign    Worried About Running Out of Food in the Last Year: Never true    Ran Out of Food in the Last Year: Never true  Transportation Needs: No Transportation Needs (09/12/2023)   PRAPARE - Administrator, Civil Service (Medical): No    Lack of Transportation (Non-Medical): No  Physical Activity: Unknown (09/12/2023)   Exercise Vital Sign    Days of Exercise per Week: 6 days    Minutes of Exercise per Session: Patient declined  Stress: No Stress Concern Present (09/12/2023)   [employer] of Occupational Health - Occupational Stress Questionnaire    Feeling of Stress : Only a little  Social Connections: Unknown (09/12/2023)   Social Connection and Isolation Panel [NHANES]    Frequency of Communication with Friends and Family: Patient declined    Frequency of Social Gatherings with Friends and Family: Patient declined    Attends Religious Services: Patient declined    Database administrator or Organizations: Patient declined    Attends Engineer, structural: More than 4 times per year    Marital Status: Married    Tobacco Counseling Counseling given: Not Answered   Clinical Intake:  Pre-visit preparation completed: Yes  Pain : No/denies pain     Diabetes: No  How often do you need to have someone help you when you read instructions, pamphlets, or other written materials from your doctor or pharmacy?: 1 - Never  Interpreter Needed?: No  Information entered by :: Kieth Pelt LPN   Activities of Daily Living    09/12/2023    8:27 AM  09/11/2023    8:16 PM  In your present state of health, do you have any difficulty performing the following activities:  Hearing? 0 0  Vision? 0 0  Difficulty concentrating or making decisions? 0 0  Walking or climbing stairs? 0 0  Dressing or bathing? 0 0  Doing errands, shopping? 0 0  Preparing Food and eating ? N N  Using the Toilet? N N  In the past six months, have you accidently leaked urine? N N  Do you have problems with loss of bowel  control? N N  Managing your Medications? N N  Managing your Finances? N N  Housekeeping or managing your Housekeeping? N N    Patient Care Team: Austine Lefort, MD as PCP - General (Family Medicine) Phebe Brasil, OD (Optometry) Katherin Pam, MD as Referring Physician (Specialist)  Indicate any recent Medical Services you may have received from other than [facility] providers in the past year (date may be approximate).     Assessment:   This is a routine wellness examination for Eilan.  Hearing/Vision screen Hearing Screening - Comments:: No trouble hearing Vision Screening - Comments:: [facility] Up to date   Goals Addressed             This Visit's Progress    Patient Stated       Do a lot of fishing       Depression Screen    09/12/2023    8:22 AM 01/08/2023    8:31 AM 09/06/2022    9:45 AM 05/29/2022   11:04 AM 01/08/2022    3:40 PM 10/05/2021    3:35 PM 09/29/2020    8:18 AM  PHQ 2/9 Scores  PHQ - 2 Score 1 0 0 0 0 0 0  PHQ- 9 Score 1      0    Fall Risk    09/12/2023    8:20 AM 09/11/2023    8:16 PM 01/08/2023    8:31 AM 09/06/2022    9:44 AM 05/29/2022   11:04 AM  Fall Risk   Falls in the past year? 0 0 0 0 0  Number falls in past yr: 0 0 0 0 0  Injury with Fall? 0 0 0 0 0  Risk for fall due to :   No Fall Risks No Fall Risks No Fall Risks  Follow up Falls evaluation completed;Education provided;Falls prevention discussed  Falls prevention discussed Falls prevention discussed;Education provided;Falls evaluation  completed Falls prevention discussed    MEDICARE RISK AT HOME: Medicare Risk at Home Any stairs in or around the home?: Yes If so, are there any without handrails?: No Home free of loose throw rugs in walkways, pet beds, electrical cords, etc?: Yes Adequate lighting in your home to reduce risk of falls?: Yes Life alert?: No Use of a cane, walker or w/c?: No Grab bars in the bathroom?: No Shower chair or bench in shower?: No Elevated toilet seat or a handicapped toilet?: No  TIMED UP AND GO:  Was the test performed?  No    Cognitive Function:        09/12/2023    8:22 AM 09/06/2022    9:46 AM 10/05/2021    3:46 PM  6CIT Screen  What Year? 0 points 0 points 0 points  What month? 0 points 0 points 0 points  What time? 0 points 0 points 0 points  Count back from 20 0 points 0 points 0 points  Months in reverse 0 points 0 points 0 points  Repeat phrase 2 points 0 points 0 points  Total Score 2 points 0 points 0 points    Immunizations Immunization History  Administered Date(s) Administered   Influenza Split 03/22/2014   Influenza Whole 05/22/2019   Influenza,inj,Quad PF,6+ Mos 02/07/2017, 03/24/2018   Pneumococcal Conjugate-13 12/11/2019   Pneumococcal Polysaccharide-23 09/29/2020   Tdap 03/24/2018    TDAP status: Up to date  Flu Vaccine status: Up to date  Pneumococcal vaccine status: Up to date  Covid-19 vaccine status: Information provided on  how to obtain vaccines.   Qualifies for Shingles Vaccine? Yes   Zostavax completed No   Shingrix Completed?: No.    Education has been provided regarding the importance of this vaccine. Patient has been advised to call insurance company to determine out of pocket expense if they have not yet received this vaccine. Advised may also receive vaccine at local pharmacy or Health Dept. Verbalized acceptance and understanding.  Screening Tests Health Maintenance  Topic Date Due   Zoster Vaccines- Shingrix (1 of 2) Never done    COVID-19 Vaccine (1 - 2024-25 season) Never done   INFLUENZA VACCINE  12/20/2023   Medicare Annual Wellness (AWV)  09/11/2024   Colonoscopy  10/06/2026   DTaP/Tdap/Td (2 - Td or Tdap) 03/24/2028   Pneumonia Vaccine 37+ Years old  Completed   Hepatitis C Screening  Completed   HPV VACCINES  Aged Out   Meningococcal B Vaccine  Aged Out    Health Maintenance  Health Maintenance Due  Topic Date Due   Zoster Vaccines- Shingrix (1 of 2) Never done   COVID-19 Vaccine (1 - 2024-25 season) Never done    Colorectal cancer screening: Type of screening: Colonoscopy. Completed 2021. Repeat every 7 years  Lung Cancer Screening: (Low Dose CT Chest recommended if Age 31-80 years, 20 pack-year currently smoking OR have quit w/in 15years.) does not qualify.   Lung Cancer Screening Referral:   Additional Screening:  Hepatitis C Screening  completed 2019  Vision Screening: Recommended annual ophthalmology exams for early detection of glaucoma and other disorders of the eye. Is the patient up to date with their annual eye exam?  Yes  Who is the provider or what is the name of the office in which the patient attends annual eye exams? [facility] If pt is not established with a provider, would they like to be referred to a provider to establish care? No .   Dental Screening: Recommended annual dental exams for proper oral hygiene    Community Resource Referral / Chronic Care Management: CRR required this visit?  No   CCM required this visit?  No     Plan:     I have personally reviewed and noted the following in the patient's chart:   Medical and social history Use of alcohol, tobacco or illicit drugs  Current medications and supplements including opioid prescriptions. Patient is not currently taking opioid prescriptions. Functional ability and status Nutritional status Physical activity Advanced directives List of other physicians Hospitalizations, surgeries, and ER visits in previous  12 months Vitals Screenings to include cognitive, depression, and falls Referrals and appointments  In addition, I have reviewed and discussed with patient certain preventive protocols, quality metrics, and best practice recommendations. A written personalized care plan for preventive services as well as general preventive health recommendations were provided to patient.     Kieth Pelt, LPN   1/32/4401   After Visit Summary: (MyChart) Due to this being a telephonic visit, the after visit summary with patients personalized plan was offered to patient via MyChart   Nurse Notes:

## 2023-09-12 NOTE — Patient Instructions (Signed)
 Mr. Daniel Rios , Thank you for taking time to come for your Medicare Wellness Visit. I appreciate your ongoing commitment to your health goals. Please review the following plan we discussed and let me know if I can assist you in the future.   Screening recommendations/referrals: Colonoscopy: up to date Recommended yearly ophthalmology/optometry visit for glaucoma screening and checkup Recommended yearly dental visit for hygiene and checkup  Vaccinations: Influenza vaccine: up to date Pneumococcal vaccine: up to date Tdap vaccine: up to date Shingles vaccine: Education provided       Preventive Care 65 Years and Older, Male Preventive care refers to lifestyle choices and visits with your health care provider that can promote health and wellness. What does preventive care include? A yearly physical exam. This is also called an annual well check. Dental exams once or twice a year. Routine eye exams. Ask your health care provider how often you should have your eyes checked. Personal lifestyle choices, including: Daily care of your teeth and gums. Regular physical activity. Eating a healthy diet. Avoiding tobacco and drug use. Limiting alcohol use. Practicing safe sex. Taking low doses of aspirin every day. Taking vitamin and mineral supplements as recommended by your health care provider. What happens during an annual well check? The services and screenings done by your health care provider during your annual well check will depend on your age, overall health, lifestyle risk factors, and family history of disease. Counseling  Your health care provider may ask you questions about your: Alcohol use. Tobacco use. Drug use. Emotional well-being. Home and relationship well-being. Sexual activity. Eating habits. History of falls. Memory and ability to understand (cognition). Work and work Astronomer. Screening  You may have the following tests or measurements: Height, weight, and  BMI. Blood pressure. Lipid and cholesterol levels. These may be checked every 5 years, or more frequently if you are over 41 years old. Skin check. Lung cancer screening. You may have this screening every year starting at age 7 if you have a 30-pack-year history of smoking and currently smoke or have quit within the past 15 years. Fecal occult blood test (FOBT) of the stool. You may have this test every year starting at age 19. Flexible sigmoidoscopy or colonoscopy. You may have a sigmoidoscopy every 5 years or a colonoscopy every 10 years starting at age 71. Prostate cancer screening. Recommendations will vary depending on your family history and other risks. Hepatitis C blood test. Hepatitis B blood test. Sexually transmitted disease (STD) testing. Diabetes screening. This is done by checking your blood sugar (glucose) after you have not eaten for a while (fasting). You may have this done every 1-3 years. Abdominal aortic aneurysm (AAA) screening. You may need this if you are a current or former smoker. Osteoporosis. You may be screened starting at age 75 if you are at high risk. Talk with your health care provider about your test results, treatment options, and if necessary, the need for more tests. Vaccines  Your health care provider may recommend certain vaccines, such as: Influenza vaccine. This is recommended every year. Tetanus, diphtheria, and acellular pertussis (Tdap, Td) vaccine. You may need a Td booster every 10 years. Zoster vaccine. You may need this after age 25. Pneumococcal 13-valent conjugate (PCV13) vaccine. One dose is recommended after age 35. Pneumococcal polysaccharide (PPSV23) vaccine. One dose is recommended after age 17. Talk to your health care provider about which screenings and vaccines you need and how often you need them. This information is not intended  to replace advice given to you by your health care provider. Make sure you discuss any questions you have  with your health care provider. Document Released: 06/03/2015 Document Revised: 01/25/2016 Document Reviewed: 03/08/2015 Elsevier Interactive Patient Education  2017 [employer].  Fall Prevention in the Home Falls can cause injuries. They can happen to people of all ages. There are many things you can do to make your home safe and to help prevent falls. What can I do on the outside of my home? Regularly fix the edges of walkways and driveways and fix any cracks. Remove anything that might make you trip as you walk through a door, such as a raised step or threshold. Trim any bushes or trees on the path to your home. Use bright outdoor lighting. Clear any walking paths of anything that might make someone trip, such as rocks or tools. Regularly check to see if handrails are loose or broken. Make sure that both sides of any steps have handrails. Any raised decks and porches should have guardrails on the edges. Have any leaves, snow, or ice cleared regularly. Use sand or salt on walking paths during winter. Clean up any spills in your garage right away. This includes oil or grease spills. What can I do in the bathroom? Use night lights. Install grab bars by the toilet and in the tub and shower. Do not use towel bars as grab bars. Use non-skid mats or decals in the tub or shower. If you need to sit down in the shower, use a plastic, non-slip stool. Keep the floor dry. Clean up any water  that spills on the floor as soon as it happens. Remove soap buildup in the tub or shower regularly. Attach bath mats securely with double-sided non-slip rug tape. Do not have throw rugs and other things on the floor that can make you trip. What can I do in the bedroom? Use night lights. Make sure that you have a light by your bed that is easy to reach. Do not use any sheets or blankets that are too big for your bed. They should not hang down onto the floor. Have a firm chair that has side arms. You can use  this for support while you get dressed. Do not have throw rugs and other things on the floor that can make you trip. What can I do in the kitchen? Clean up any spills right away. Avoid walking on wet floors. Keep items that you use a lot in easy-to-reach places. If you need to reach something above you, use a strong step stool that has a grab bar. Keep electrical cords out of the way. Do not use floor polish or wax that makes floors slippery. If you must use wax, use non-skid floor wax. Do not have throw rugs and other things on the floor that can make you trip. What can I do with my stairs? Do not leave any items on the stairs. Make sure that there are handrails on both sides of the stairs and use them. Fix handrails that are broken or loose. Make sure that handrails are as long as the stairways. Check any carpeting to make sure that it is firmly attached to the stairs. Fix any carpet that is loose or worn. Avoid having throw rugs at the top or bottom of the stairs. If you do have throw rugs, attach them to the floor with carpet tape. Make sure that you have a light switch at the top of the stairs and  the bottom of the stairs. If you do not have them, ask someone to add them for you. What else can I do to help prevent falls? Wear shoes that: Do not have high heels. Have rubber bottoms. Are comfortable and fit you well. Are closed at the toe. Do not wear sandals. If you use a stepladder: Make sure that it is fully opened. Do not climb a closed stepladder. Make sure that both sides of the stepladder are locked into place. Ask someone to hold it for you, if possible. Clearly mark and make sure that you can see: Any grab bars or handrails. First and last steps. Where the edge of each step is. Use tools that help you move around (mobility aids) if they are needed. These include: Canes. Walkers. Scooters. Crutches. Turn on the lights when you go into a dark area. Replace any light bulbs  as soon as they burn out. Set up your furniture so you have a clear path. Avoid moving your furniture around. If any of your floors are uneven, fix them. If there are any pets around you, be aware of where they are. Review your medicines with your doctor. Some medicines can make you feel dizzy. This can increase your chance of falling. Ask your doctor what other things that you can do to help prevent falls. This information is not intended to replace advice given to you by your health care provider. Make sure you discuss any questions you have with your health care provider. Document Released: 03/03/2009 Document Revised: 10/13/2015 Document Reviewed: 06/11/2014 Elsevier Interactive Patient Education  2017 [employer].

## 2023-11-21 ENCOUNTER — Other Ambulatory Visit: Payer: Self-pay | Admitting: Family Medicine

## 2023-11-21 DIAGNOSIS — I10 Essential (primary) hypertension: Secondary | ICD-10-CM

## 2024-01-06 DIAGNOSIS — D485 Neoplasm of uncertain behavior of skin: Secondary | ICD-10-CM | POA: Diagnosis not present

## 2024-01-06 DIAGNOSIS — C44519 Basal cell carcinoma of skin of other part of trunk: Secondary | ICD-10-CM | POA: Diagnosis not present

## 2024-01-06 DIAGNOSIS — D0461 Carcinoma in situ of skin of right upper limb, including shoulder: Secondary | ICD-10-CM | POA: Diagnosis not present

## 2024-01-31 ENCOUNTER — Encounter: Payer: Self-pay | Admitting: Family Medicine

## 2024-02-03 ENCOUNTER — Other Ambulatory Visit: Payer: Self-pay | Admitting: Family Medicine

## 2024-02-03 DIAGNOSIS — E7800 Pure hypercholesterolemia, unspecified: Secondary | ICD-10-CM

## 2024-02-03 DIAGNOSIS — I10 Essential (primary) hypertension: Secondary | ICD-10-CM

## 2024-02-13 ENCOUNTER — Other Ambulatory Visit: Payer: Self-pay | Admitting: Family Medicine

## 2024-02-18 ENCOUNTER — Ambulatory Visit (HOSPITAL_BASED_OUTPATIENT_CLINIC_OR_DEPARTMENT_OTHER)
Admission: RE | Admit: 2024-02-18 | Discharge: 2024-02-18 | Disposition: A | Discharge status: Home / Self Care | Payer: Self-pay | Source: Ambulatory Visit | Attending: Family Medicine | Admitting: Family Medicine

## 2024-02-18 DIAGNOSIS — E7800 Pure hypercholesterolemia, unspecified: Secondary | ICD-10-CM | POA: Insufficient documentation

## 2024-02-18 DIAGNOSIS — I10 Essential (primary) hypertension: Secondary | ICD-10-CM | POA: Insufficient documentation

## 2024-02-19 DIAGNOSIS — D0461 Carcinoma in situ of skin of right upper limb, including shoulder: Secondary | ICD-10-CM | POA: Diagnosis not present

## 2024-02-20 ENCOUNTER — Ambulatory Visit: Payer: Self-pay | Admitting: Family Medicine

## 2024-02-20 ENCOUNTER — Ambulatory Visit: Admitting: Family Medicine

## 2024-02-20 VITALS — BP 132/84 | HR 87 | Temp 98.0°F | Ht 72.0 in | Wt 196.0 lb

## 2024-02-20 DIAGNOSIS — Z125 Encounter for screening for malignant neoplasm of prostate: Secondary | ICD-10-CM | POA: Diagnosis not present

## 2024-02-20 DIAGNOSIS — R739 Hyperglycemia, unspecified: Secondary | ICD-10-CM | POA: Diagnosis not present

## 2024-02-20 DIAGNOSIS — E7800 Pure hypercholesterolemia, unspecified: Secondary | ICD-10-CM

## 2024-02-20 DIAGNOSIS — I10 Essential (primary) hypertension: Secondary | ICD-10-CM

## 2024-02-20 DIAGNOSIS — Z0001 Encounter for general adult medical examination with abnormal findings: Secondary | ICD-10-CM

## 2024-02-20 DIAGNOSIS — Z23 Encounter for immunization: Secondary | ICD-10-CM

## 2024-02-20 DIAGNOSIS — Z0000 Encounter for general adult medical examination without abnormal findings: Secondary | ICD-10-CM

## 2024-02-20 NOTE — Progress Notes (Signed)
 Subjective:    Patient ID: Daniel Rios, male    DOB: 05-Rios-1957, 68 y.o.   MRN: [id number]  HPI  Patient is a very pleasant 68 year old Caucasian gentleman who presents today for complete physical exam.  His last colonoscopy was in 2021.  GI recommended a repeat colonoscopy in 2028.  He is due for a flu shot which he declines.  He is due for the shingles vaccine which he received.  He recently had a coronary artery calcium  score that showed minimal plaque in his right coronary artery and in his LAD.  Total calcium  score was less than 50th percentile.  Total calcium  score was under 100.   Past Medical History:  Diagnosis Date   Allergy    Hyperlipidemia    Hypertension    Strabismus    Past Surgical History:  Procedure Laterality Date   COLONOSCOPY  11-01-2003   normal   COLONOSCOPY  06/21/2014   1-TA   FOOT SURGERY Right 2019   Current Outpatient Medications on File Prior to Visit  Medication Sig Dispense Refill   amLODipine  (NORVASC ) 5 MG tablet TAKE ONE TABLET BY MOUTH EVERY DAY 90 tablet 1   Boswellia-Glucosamine-Vit D (OSTEO BI-FLEX ONE PER DAY PO) Take by mouth.     losartan  (COZAAR ) 100 MG tablet TAKE 1 TABLET BY MOUTH DAILY 90 tablet 1   Multiple Vitamin (MULTIVITAMIN) tablet Take 1 tablet by mouth daily. One a day 50+ 90 tablet 1   rosuvastatin  (CRESTOR ) 20 MG tablet TAKE 1 TABLET(20 MG) BY MOUTH DAILY 90 tablet 1   tamsulosin  (FLOMAX ) 0.4 MG CAPS capsule TAKE ONE CAPSULE BY MOUTH AT BEDTIME 30 capsule 0   No current facility-administered medications on file prior to visit.     Allergies  Allergen Reactions   Celebrex  [Celecoxib ] Swelling and Rash   Social History   Socioeconomic History   Marital status: Married    Spouse name: Not on file   Number of children: Not on file   Years of education: Not on file   Highest education level: 12th grade  Occupational History   Not on file  Tobacco Use   Smoking status: Former    Current packs/day: 0.00     Types: Cigarettes    Quit date: 09/21/1980    Years since quitting: 43.4   Smokeless tobacco: Never  Vaping Use   Vaping status: Never Used  Substance and Sexual Activity   Alcohol use: Yes   Drug use: No   Sexual activity: Not Currently    Comment: married, quit smoking in 1982.    Other Topics Concern   Not on file  Social History Narrative   Not on file   Social Drivers of Health   Financial Resource Strain: Low Risk  (02/20/2024)   Overall Financial Resource Strain (CARDIA)    Difficulty of Paying Living Expenses: Not very hard  Food Insecurity: No Food Insecurity (02/20/2024)   Hunger Vital Sign    Worried About Running Out of Food in the Last Year: Never true    Ran Out of Food in the Last Year: Never true  Transportation Needs: No Transportation Needs (02/20/2024)   PRAPARE - Administrator, Civil Service (Medical): No    Lack of Transportation (Non-Medical): No  Physical Activity: Unknown (02/20/2024)   Exercise Vital Sign    Days of Exercise per Week: Patient declined    Minutes of Exercise per Session: Not on file  Stress: No Stress Concern Present (  02/20/2024)   [employer] [employer] - Occupational Stress Questionnaire    Feeling of Stress: Only a little  Social Connections: Unknown (02/20/2024)   Social Connection and Isolation Panel    Frequency of Communication with Friends and Family: Once a week    Frequency of Social Gatherings with Friends and Family: Patient declined    Attends Religious Services: Patient declined    Database administrator or Organizations: No    Attends Engineer, structural: Not on file    Marital Status: Married  Catering manager Violence: Not At Risk (09/12/2023)   Humiliation, Afraid, Rape, and Kick questionnaire    Fear of Current or Ex-Partner: No    Emotionally Abused: No    Physically Abused: No    Sexually Abused: No     Review of Systems  All other systems reviewed and are negative.       Objective:   Physical Exam Vitals reviewed.  Constitutional:      General: He is not in acute distress.    Appearance: He is well-developed. He is not diaphoretic.  HENT:     Head: Normocephalic and atraumatic.     Right Ear: External ear normal.     Left Ear: External ear normal.     Nose: Nose normal.     Mouth/Throat:     Pharynx: No oropharyngeal exudate.  Eyes:     General: No scleral icterus.       Right eye: No discharge.        Left eye: No discharge.     Conjunctiva/sclera: Conjunctivae normal.     Pupils: Pupils are equal, round, and reactive to light.  Neck:     Thyroid: No thyromegaly.     Vascular: No JVD.     Trachea: No tracheal deviation.  Cardiovascular:     Rate and Rhythm: Normal rate and regular rhythm.     Heart sounds: Normal heart sounds. No murmur heard.    No friction rub. No gallop.  Pulmonary:     Effort: Pulmonary effort is normal. No respiratory distress.     Breath sounds: Normal breath sounds. No stridor. No wheezing or rales.  Chest:     Chest wall: No tenderness.  Abdominal:     General: Bowel sounds are normal. There is no distension.     Palpations: Abdomen is soft. There is no mass.     Tenderness: There is no abdominal tenderness. There is no guarding or rebound.  Musculoskeletal:        General: No tenderness. Normal range of motion.     Cervical back: Normal range of motion and neck supple.  Lymphadenopathy:     Cervical: No cervical adenopathy.  Skin:    General: Skin is warm.     Coloration: Skin is not pale.     Findings: No erythema or rash.  Neurological:     Mental Status: He is alert and oriented to person, place, and time.     Cranial Nerves: No cranial nerve deficit.     Motor: No abnormal muscle tone.     Coordination: Coordination normal.     Deep Tendon Reflexes: Reflexes are normal and symmetric.  Psychiatric:        Behavior: Behavior normal.        Thought Content: Thought content normal.        Judgment:  Judgment normal.           Assessment & Plan:  Prostate cancer screening - Plan: CBC with Differential/Platelet, Comprehensive metabolic panel with GFR, PSA  Need for shingles vaccine - Plan: Varicella-zoster vaccine IM  Benign essential HTN - Plan: Boswellia-Glucosamine-Vit D (OSTEO BI-FLEX ONE PER DAY PO), CBC with Differential/Platelet, Comprehensive metabolic panel with GFR, PSA, Varicella-zoster vaccine IM  Pure hypercholesterolemia - Plan: Boswellia-Glucosamine-Vit D (OSTEO BI-FLEX ONE PER DAY PO), CBC with Differential/Platelet, Comprehensive metabolic panel with GFR, PSA, Varicella-zoster vaccine IM  General medical exam Colonoscopy is up-to-date.  I will screen for prostate cancer with a PSA.  Coronary artery calcium  score is outstanding.  We discussed whether he wants to stay on his cholesterol medication at the present time.  He would like to think about this and let me know.  His blood pressure is excellent.  He received his shingles vaccine today.  He declined the flu shot.

## 2024-02-21 ENCOUNTER — Ambulatory Visit: Payer: Self-pay | Admitting: Family Medicine

## 2024-02-24 LAB — CBC WITH DIFFERENTIAL/PLATELET
Absolute Lymphocytes: 2612 {cells}/uL (ref 850–3900)
Absolute Monocytes: 571 {cells}/uL (ref 200–950)
Basophils Absolute: 92 {cells}/uL (ref 0–200)
Basophils Relative: 1.1 %
Eosinophils Absolute: 143 {cells}/uL (ref 15–500)
Eosinophils Relative: 1.7 %
HCT: 49.3 % (ref 38.5–50.0)
Hemoglobin: 16.3 g/dL (ref 13.2–17.1)
MCH: 32.9 pg (ref 27.0–33.0)
MCHC: 33.1 g/dL (ref 32.0–36.0)
MCV: 99.6 fL (ref 80.0–100.0)
MPV: 9.3 fL (ref 7.5–12.5)
Monocytes Relative: 6.8 %
Neutro Abs: 4981 {cells}/uL (ref 1500–7800)
Neutrophils Relative %: 59.3 %
Platelets: 340 Thousand/uL (ref 140–400)
RBC: 4.95 Million/uL (ref 4.20–5.80)
RDW: 12.1 % (ref 11.0–15.0)
Total Lymphocyte: 31.1 %
WBC: 8.4 Thousand/uL (ref 3.8–10.8)

## 2024-02-24 LAB — COMPREHENSIVE METABOLIC PANEL WITH GFR
AG Ratio: 1.6 (calc) (ref 1.0–2.5)
ALT: 32 U/L (ref 9–46)
AST: 27 U/L (ref 10–35)
Albumin: 4.3 g/dL (ref 3.6–5.1)
Alkaline phosphatase (APISO): 58 U/L (ref 35–144)
BUN/Creatinine Ratio: 14 (calc) (ref 6–22)
BUN: 18 mg/dL (ref 7–25)
CO2: 30 mmol/L (ref 20–32)
Calcium: 9.5 mg/dL (ref 8.6–10.3)
Chloride: 103 mmol/L (ref 98–110)
Creat: 1.29 mg/dL — ABNORMAL HIGH (ref 0.70–1.28)
Globulin: 2.7 g/dL (ref 1.9–3.7)
Glucose, Bld: 122 mg/dL — ABNORMAL HIGH (ref 65–99)
Potassium: 4.9 mmol/L (ref 3.5–5.3)
Sodium: 139 mmol/L (ref 135–146)
Total Bilirubin: 0.5 mg/dL (ref 0.2–1.2)
Total Protein: 7 g/dL (ref 6.1–8.1)
eGFR: 60 mL/min/1.73m2 (ref >=60)

## 2024-02-24 LAB — HEMOGLOBIN A1C
Hgb A1c MFr Bld: 5.5 % (ref <5.7)
Mean Plasma Glucose: 111 mg/dL
eAG (mmol/L): 6.2 mmol/L

## 2024-02-24 LAB — TEST AUTHORIZATION

## 2024-02-24 LAB — PSA: PSA: 1.82 ng/mL (ref <=4.00)

## 2024-02-27 ENCOUNTER — Other Ambulatory Visit: Payer: Self-pay | Admitting: Family Medicine

## 2024-02-27 DIAGNOSIS — E7800 Pure hypercholesterolemia, unspecified: Secondary | ICD-10-CM

## 2024-03-10 NOTE — Progress Notes (Signed)
   03/10/2024  Patient ID: Daniel Rios, male   DOB: 1956/01/16, 67 y.o.   MRN: [id number]  Pharmacy Quality Measure Review  This patient is appearing on a report for being at risk of failing the adherence measure for cholesterol (statin) medications this calendar year.   Medication: rosuvastatin  20mg  Last fill date: 03/02/2024 for 90 day supply  Insurance report was not up to date. No action needed at this time.   Lang Sieve, PharmD, BCGP Clinical Pharmacist  [phone]

## 2024-03-23 ENCOUNTER — Other Ambulatory Visit: Payer: Self-pay | Admitting: Family Medicine

## 2024-04-06 DIAGNOSIS — D2261 Melanocytic nevi of right upper limb, including shoulder: Secondary | ICD-10-CM | POA: Diagnosis not present

## 2024-04-06 DIAGNOSIS — D225 Melanocytic nevi of trunk: Secondary | ICD-10-CM | POA: Diagnosis not present

## 2024-04-06 DIAGNOSIS — D485 Neoplasm of uncertain behavior of skin: Secondary | ICD-10-CM | POA: Diagnosis not present

## 2024-04-06 DIAGNOSIS — L570 Actinic keratosis: Secondary | ICD-10-CM | POA: Diagnosis not present

## 2024-04-06 DIAGNOSIS — Z85828 Personal history of other malignant neoplasm of skin: Secondary | ICD-10-CM | POA: Diagnosis not present

## 2024-04-06 DIAGNOSIS — D2271 Melanocytic nevi of right lower limb, including hip: Secondary | ICD-10-CM | POA: Diagnosis not present

## 2024-04-06 DIAGNOSIS — D2262 Melanocytic nevi of left upper limb, including shoulder: Secondary | ICD-10-CM | POA: Diagnosis not present

## 2024-04-06 DIAGNOSIS — L820 Inflamed seborrheic keratosis: Secondary | ICD-10-CM | POA: Diagnosis not present

## 2024-04-06 DIAGNOSIS — Z08 Encounter for follow-up examination after completed treatment for malignant neoplasm: Secondary | ICD-10-CM | POA: Diagnosis not present

## 2024-04-06 DIAGNOSIS — D2272 Melanocytic nevi of left lower limb, including hip: Secondary | ICD-10-CM | POA: Diagnosis not present

## 2024-04-06 DIAGNOSIS — L821 Other seborrheic keratosis: Secondary | ICD-10-CM | POA: Diagnosis not present

## 2024-04-20 ENCOUNTER — Telehealth: Payer: Self-pay

## 2024-04-20 NOTE — Telephone Encounter (Signed)
 Prescription Request  04/20/2024  LOV: 02/20/24  What is the name of the medication or equipment? tamsulosin  (FLOMAX ) 0.4 MG CAPS capsule [[id number]]   Have you contacted your pharmacy to request a refill? Yes   Which pharmacy would you like this sent to?  [facility] [place], [place] - [id number] [place] [address] Phone: [phone] Fax: [phone]    Patient notified that their request is being sent to the clinical staff for review and that they should receive a response within 2 business days.   Please advise at [facility] [phone]

## 2024-04-23 MED ORDER — TAMSULOSIN HCL 0.4 MG PO CAPS: 0.4000 mg | ORAL_CAPSULE | Freq: Every day | ORAL | 0 refills | Status: DC

## 2024-04-23 NOTE — Telephone Encounter (Signed)
 Requested Prescriptions  Pending Prescriptions Disp Refills   tamsulosin  (FLOMAX ) 0.4 MG CAPS capsule 30 capsule 0    Sig: Take 1 capsule (0.4 mg total) by mouth at bedtime.     Urology: Alpha-Adrenergic Blocker Passed - 04/23/2024  1:41 PM      Passed - PSA in normal range and within 360 days    PSA  Date Value Ref Range Status  02/20/2024 1.82 < OR = 4.00 ng/mL Final    Comment:    The total PSA value from this assay system is  standardized against the WHO standard. The test  result will be approximately 20% lower when compared  to the equimolar-standardized total PSA (Beckman  Coulter). Comparison of serial PSA results should be  interpreted with this fact in mind. . This test was performed using the Siemens  chemiluminescent method. Values obtained from  different assay methods cannot be used interchangeably. PSA levels, regardless of value, should not be interpreted as absolute evidence of the presence or absence of disease.          Passed - Last BP in normal range    BP Readings from Last 1 Encounters:  02/20/24 132/84         Passed - Valid encounter within last 12 months    Recent Outpatient Visits           2 months ago Prostate cancer screening   [place] [facility] Duanne Butler DASEN, MD   1 year ago Benign essential HTN   [facility] Duanne Butler DASEN, MD   1 year ago Chronic diarrhea   [facility] Duanne Butler DASEN, MD   1 year ago Benign essential HTN   [facility] Duanne Butler DASEN, MD   2 years ago Neuropathy   [facility] Pickard, Butler DASEN, MD

## 2024-04-24 ENCOUNTER — Telehealth: Payer: Self-pay

## 2024-04-24 ENCOUNTER — Other Ambulatory Visit: Payer: Self-pay | Admitting: Family Medicine

## 2024-04-24 NOTE — Telephone Encounter (Signed)
 Copied from CRM [id number]. Topic: Clinical - Medication Refill >> Apr 24, 2024 11:13 AM Berwyn MATSU wrote: Medication: tamsulosin  (FLOMAX ) 0.4 MG CAPS capsule; patient is requesting a  90 day supply as it was back in the past.   Has the patient contacted their pharmacy? Yes (Agent: If no, request that the patient contact the pharmacy for the refill. If patient does not wish to contact the pharmacy document the reason why and proceed with request.) (Agent: If yes, when and what did the pharmacy advise?)  This is the patient's preferred pharmacy:  [facility] [place], [place] - [id number] [place] [address] Phone: [phone] Fax: [phone]  Is this the correct pharmacy for this prescription? Yes If no, delete pharmacy and type the correct one.   Has the prescription been filled recently? Yes  Is the patient out of the medication? No  Has the patient been seen for an appointment in the last year OR does the patient have an upcoming appointment? Yes  Can we respond through MyChart? Yes  Agent: Please be advised that Rx refills may take up to 3 business days. We ask that you follow-up with your pharmacy.

## 2024-04-24 NOTE — Telephone Encounter (Signed)
 Copied from CRM [id number]. Topic: Clinical - Medication Question >> Apr 24, 2024 11:11 AM Berwyn MATSU wrote: Reason for CRM:  Patient would like Allergies/ Contradictions to be updated where he can see  Celebrex  listed.   May you please assist.

## 2024-04-27 MED ORDER — TAMSULOSIN HCL 0.4 MG PO CAPS: 0.4000 mg | ORAL_CAPSULE | Freq: Every day | ORAL | 1 refills | Status: AC

## 2024-04-27 NOTE — Telephone Encounter (Signed)
 Requested Prescriptions  Pending Prescriptions Disp Refills   tamsulosin  (FLOMAX ) 0.4 MG CAPS capsule 90 capsule 1    Sig: Take 1 capsule (0.4 mg total) by mouth at bedtime.     Urology: Alpha-Adrenergic Blocker Passed - 04/27/2024  3:41 PM      Passed - PSA in normal range and within 360 days    PSA  Date Value Ref Range Status  02/20/2024 1.82 < OR = 4.00 ng/mL Final    Comment:    The total PSA value from this assay system is  standardized against the WHO standard. The test  result will be approximately 20% lower when compared  to the equimolar-standardized total PSA (Beckman  Coulter). Comparison of serial PSA results should be  interpreted with this fact in mind. . This test was performed using the Siemens  chemiluminescent method. Values obtained from  different assay methods cannot be used interchangeably. PSA levels, regardless of value, should not be interpreted as absolute evidence of the presence or absence of disease.          Passed - Last BP in normal range    BP Readings from Last 1 Encounters:  02/20/24 132/84         Passed - Valid encounter within last 12 months    Recent Outpatient Visits           2 months ago Prostate cancer screening   [place] [facility] Duanne Butler DASEN, MD   1 year ago Benign essential HTN   [facility] Duanne Butler DASEN, MD   1 year ago Chronic diarrhea   [place] Medicine Duanne Butler DASEN, MD   1 year ago Benign essential HTN   [facility] [facility] Duanne Butler DASEN, MD   2 years ago Neuropathy   [facility] Pickard, Butler DASEN, MD

## 2024-05-07 ENCOUNTER — Other Ambulatory Visit: Payer: Self-pay | Admitting: Family Medicine

## 2024-05-07 NOTE — Telephone Encounter (Unsigned)
 Copied from CRM #[id number]. Topic: Clinical - Medication Refill >> May 07, 2024  9:52 AM Zy'onna H wrote: Medication:  tamsulosin  (FLOMAX ) 0.4 MG CAPS capsule  **Patient called in requesting a 90- day supply of this Rx he requested it on 04/24/2024 but stated he hasn't heard anything back yet and his pharmacy stated they have not received an order from PCP**  - Please Advise  Has the patient contacted their pharmacy? Yes (Agent: If no, request that the patient contact the pharmacy for the refill. If patient does not wish to contact the pharmacy document the reason why and proceed with request.) (Agent: If yes, when and what did the pharmacy advise?)  This is the patient's preferred pharmacy:  [facility] [place], [place] - [id number] [place] [address] Phone: [phone] Fax: [phone]  Is this the correct pharmacy for this prescription? Yes If no, delete pharmacy and type the correct one.   Has the prescription been filled recently? Yes  Is the patient out of the medication? No  Has the patient been seen for an appointment in the last year OR does the patient have an upcoming appointment? Yes  Can we respond through MyChart? Yes  Agent: Please be advised that Rx refills may take up to 3 business days. We ask that you follow-up with your pharmacy.

## 2024-05-11 NOTE — Telephone Encounter (Signed)
 Refilled 04/27/24 # 90 with 1 refill. Requested Prescriptions  Refused Prescriptions Disp Refills   tamsulosin  (FLOMAX ) 0.4 MG CAPS capsule 90 capsule 1    Sig: Take 1 capsule (0.4 mg total) by mouth at bedtime.     Urology: Alpha-Adrenergic Blocker Passed - 05/11/2024 12:56 PM      Passed - PSA in normal range and within 360 days    PSA  Date Value Ref Range Status  02/20/2024 1.82 < OR = 4.00 ng/mL Final    Comment:    The total PSA value from this assay system is  standardized against the WHO standard. The test  result will be approximately 20% lower when compared  to the equimolar-standardized total PSA (Beckman  Coulter). Comparison of serial PSA results should be  interpreted with this fact in mind. . This test was performed using the Siemens  chemiluminescent method. Values obtained from  different assay methods cannot be used interchangeably. PSA levels, regardless of value, should not be interpreted as absolute evidence of the presence or absence of disease.          Passed - Last BP in normal range    BP Readings from Last 1 Encounters:  02/20/24 132/84         Passed - Valid encounter within last 12 months    Recent Outpatient Visits           2 months ago Prostate cancer screening   [place] [facility] Duanne Butler DASEN, MD   1 year ago Benign essential HTN   [place] [facility] Duanne Butler DASEN, MD   1 year ago Chronic diarrhea   [place] [facility] Duanne Butler DASEN, MD   1 year ago Benign essential HTN   [place] [facility] Duanne Butler DASEN, MD   2 years ago Neuropathy    [facility] [place], Butler DASEN, MD

## 2024-05-22 ENCOUNTER — Telehealth: Payer: Self-pay

## 2024-05-22 ENCOUNTER — Other Ambulatory Visit: Payer: Self-pay

## 2024-05-22 MED ORDER — LOSARTAN POTASSIUM 100 MG PO TABS: 100.0000 mg | ORAL_TABLET | Freq: Every day | ORAL | 1 refills | Status: AC

## 2024-05-22 NOTE — Telephone Encounter (Signed)
 Sent in medication

## 2024-05-22 NOTE — Telephone Encounter (Addendum)
 Prescription Request  05/22/2024  LOV: 02/20/24  What is the name of the medication or equipment? losartan  (COZAAR ) 100 MG tablet [[id number]]   Have you contacted your pharmacy to request a refill? Yes   Which pharmacy would you like this sent to?  [facility] [place], [place] - [id number] [place] [address] Phone: [phone] Fax: [phone]    Patient notified that their request is being sent to the clinical staff for review and that they should receive a response within 2 business days.   Please advise at [facility] [phone]  Prescription Request  05/22/2024  LOV: 02/20/24  What is the name of the medication or equipment? amLODipine  (NORVASC ) 5 MG tablet [[id number]]   Have you contacted your pharmacy to request a refill? Yes   Which pharmacy would you like this sent to?  [facility] [place], [place] - [id number] [place] [address] Phone: [phone] Fax: [phone]    Patient notified that their request is being sent to the clinical staff for review and that they should receive a response within 2 business days.   Please advise at [facility] [phone]

## 2024-06-01 ENCOUNTER — Ambulatory Visit

## 2024-06-01 DIAGNOSIS — Z23 Encounter for immunization: Secondary | ICD-10-CM

## 2024-06-01 NOTE — Progress Notes (Signed)
 Patient is in office today for a nurse visit for Immunization. Patient Injection was given in the  Right deltoid. Patient tolerated injection well.

## 2024-06-03 ENCOUNTER — Other Ambulatory Visit: Payer: Self-pay | Admitting: Family Medicine

## 2024-06-03 DIAGNOSIS — I10 Essential (primary) hypertension: Secondary | ICD-10-CM

## 2024-09-17 ENCOUNTER — Encounter

## 2025-02-22 ENCOUNTER — Encounter: Admitting: Family Medicine
